# Patient Record
Sex: Female | Born: 1960 | Race: Black or African American | Hispanic: No | Marital: Single | Smoking: Never smoker
Health system: Southern US, Community
[De-identification: ages and names within clinical notes are randomized; demographics above are authoritative.]

## PROBLEM LIST (undated history)

## (undated) DIAGNOSIS — E079 Disorder of thyroid, unspecified: Secondary | ICD-10-CM

## (undated) HISTORY — PX: GASTRIC BYPASS: SHX52

---

## 2021-07-10 ENCOUNTER — Emergency Department
Admission: EM | Admit: 2021-07-10 | Discharge: 2021-07-10 | Disposition: A | Discharge status: Home / Self Care | Payer: Self-pay | Attending: Emergency Medicine | Admitting: Emergency Medicine

## 2021-07-10 ENCOUNTER — Other Ambulatory Visit: Payer: Self-pay

## 2021-07-10 DIAGNOSIS — C4922 Malignant neoplasm of connective and soft tissue of left lower limb, including hip: Secondary | ICD-10-CM | POA: Insufficient documentation

## 2021-07-10 HISTORY — DX: Disorder of thyroid, unspecified: E07.9

## 2021-07-10 MED ORDER — HYDROCODONE-ACETAMINOPHEN 5-325 MG PO TABS: 1.0000 | ORAL_TABLET | Freq: Four times a day (QID) | ORAL | 0 refills | Status: DC | PRN

## 2021-07-10 NOTE — ED Triage Notes (Signed)
Pt to ED for left groin pain for over a month. States has already had MRI and was referred to ortho oncologist d/t mass found. States she is in pain and here to get seen for "everything"

## 2021-07-10 NOTE — Discharge Instructions (Addendum)
[facility] [place], [place], [place] [address] Phone: [phone]  [facility] Call [phone] (between 8:00am - 4:30pm, Monday - Friday)

## 2021-07-10 NOTE — ED Provider Notes (Signed)
[facility] Emergency Department Provider Note  ____________________________________________   Event Date/Time   First MD Initiated Contact with Patient 07/10/21 [phone]     (approximate)  I have reviewed the triage vital signs and the nursing notes.   HISTORY  Chief Complaint Groin Pain    HPI Jennifer Butler is a 60 y.o. female presents emergency department complaining of left groin pain.  Patient was seen at a urgent care in [place] and was then told she needed an MRI.  Had an MRI done which showed a sarcoma.  Patient states she is new to the area and does not know who to follow-up with.  She is continuing to have pain.  She does have her reports and disc showing the sarcoma  Past Medical History:  Diagnosis Date   Thyroid disease     There are no problems to display for this patient.   Past Surgical History:  Procedure Laterality Date   GASTRIC BYPASS      Prior to Admission medications   Medication Sig Start Date End Date Taking? Authorizing Provider  HYDROcodone-acetaminophen (NORCO/VICODIN) 5-325 MG tablet Take 1 tablet by mouth every 6 (six) hours as needed for moderate pain. 07/10/21  Yes Felesha Moncrieffe, Linden Dolin, PA-C  levothyroxine (SYNTHROID) 112 MCG tablet Take 112 mcg by mouth daily before breakfast.   Yes [provider]    Allergies Zithromax [azithromycin], Eliquis [apixaban], and Xarelto [rivaroxaban]  No family history on file.  Social History    Review of Systems  Constitutional: No fever/chills Eyes: No visual changes. ENT: No sore throat. Respiratory: Denies cough Cardiovascular: Denies chest pain Gastrointestinal: Denies abdominal pain Genitourinary: Negative for dysuria. Musculoskeletal: Negative for back pain. Skin: Negative for rash. Psychiatric: no mood changes,     ____________________________________________   PHYSICAL EXAM:  VITAL SIGNS: ED Triage Vitals  Enc Vitals Group     BP 07/10/21  0902 129/73     Pulse Rate 07/10/21 0902 83     Resp 07/10/21 0902 18     Temp 07/10/21 0902 98.1 F (36.7 C)     Temp Source 07/10/21 0902 Oral     SpO2 07/10/21 0902 99 %     Weight 07/10/21 0906 180 lb (81.6 kg)     Height 07/10/21 0906 5\' 6"  (1.676 m)     Head Circumference --      Peak Flow --      Pain Score 07/10/21 0904 10     Pain Loc --      Pain Edu? --      Excl. in [place]? --     Constitutional: Alert and oriented. Well appearing and in no acute distress. Eyes: Conjunctivae are normal.  Head: Atraumatic. Nose: No congestion/rhinnorhea. Mouth/Throat: Mucous membranes are moist.   Neck:  supple no lymphadenopathy noted Cardiovascular: Normal rate, regular rhythm.  Respiratory: Normal respiratory effort.  No retractions,  GU: deferred Musculoskeletal: FROM all extremities, warm and well perfused Neurologic:  Normal speech and language.  Skin:  Skin is warm, dry and intact. No rash noted. Psychiatric: Mood and affect are normal. Speech and behavior are normal.  ____________________________________________   LABS (all labs ordered are listed, but only abnormal results are displayed)  Labs Reviewed - No data to display ____________________________________________   ____________________________________________  RADIOLOGY    ____________________________________________   PROCEDURES  Procedure(s) performed: No  Procedures    ____________________________________________   INITIAL IMPRESSION / ASSESSMENT AND PLAN / ED COURSE  Pertinent labs &  imaging results that were available during my care of the patient were reviewed by me and considered in my medical decision making (see chart for details).   60 year old female presents emergency department with concerns of a sarcoma in her left thigh/groin area.  See HPI.  Physical exam shows patient per stable  The patient's report states that she does have a sarcoma but appears to be in the muscle.    Consult  orthopedics.  Dr.Poggi states they usually send those patients to [facility] orthopedic oncology.  I did discuss this with the patient.  Explained her she could follow-up with either [facility] orthopedic oncology or [facility].  Phone numbers for both clinics were given to the patient.  She is uninsured but I did reassure her that they have charity care and will help her financially.  She was given a prescription for pain medication.  Discharged in stable condition.     Jennifer Butler was evaluated in Emergency Department on 07/10/2021 for the symptoms described in the history of present illness. She was evaluated in the context of the global COVID-19 pandemic, which necessitated consideration that the patient might be at risk for infection with the SARS-CoV-2 virus that causes COVID-19. Institutional protocols and algorithms that pertain to the evaluation of patients at risk for COVID-19 are in a state of rapid change based on information released by regulatory bodies including the CDC and federal and state organizations. These policies and algorithms were followed during the patient's care in the ED.    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     Note:  This document was prepared using Dragon voice recognition software and may include unintentional dictation errors.    Versie Starks, PA-C 07/10/21 1044    Blake Divine, MD 07/10/21  [phone]

## 2021-09-05 DIAGNOSIS — M7989 Other specified soft tissue disorders: Secondary | ICD-10-CM | POA: Diagnosis not present

## 2021-09-05 DIAGNOSIS — C499 Malignant neoplasm of connective and soft tissue, unspecified: Secondary | ICD-10-CM | POA: Diagnosis not present

## 2021-09-05 DIAGNOSIS — C4920 Malignant neoplasm of connective and soft tissue of unspecified lower limb, including hip: Secondary | ICD-10-CM | POA: Diagnosis not present

## 2021-09-06 DIAGNOSIS — C4920 Malignant neoplasm of connective and soft tissue of unspecified lower limb, including hip: Secondary | ICD-10-CM | POA: Diagnosis not present

## 2021-09-07 DIAGNOSIS — C4920 Malignant neoplasm of connective and soft tissue of unspecified lower limb, including hip: Secondary | ICD-10-CM | POA: Diagnosis not present

## 2021-09-08 DIAGNOSIS — C4920 Malignant neoplasm of connective and soft tissue of unspecified lower limb, including hip: Secondary | ICD-10-CM | POA: Diagnosis not present

## 2021-09-11 DIAGNOSIS — C4920 Malignant neoplasm of connective and soft tissue of unspecified lower limb, including hip: Secondary | ICD-10-CM | POA: Diagnosis not present

## 2021-09-12 DIAGNOSIS — C4920 Malignant neoplasm of connective and soft tissue of unspecified lower limb, including hip: Secondary | ICD-10-CM | POA: Diagnosis not present

## 2021-09-12 DIAGNOSIS — C499 Malignant neoplasm of connective and soft tissue, unspecified: Secondary | ICD-10-CM | POA: Diagnosis not present

## 2021-09-13 DIAGNOSIS — C4920 Malignant neoplasm of connective and soft tissue of unspecified lower limb, including hip: Secondary | ICD-10-CM | POA: Diagnosis not present

## 2021-09-14 DIAGNOSIS — C499 Malignant neoplasm of connective and soft tissue, unspecified: Secondary | ICD-10-CM | POA: Diagnosis not present

## 2021-09-14 DIAGNOSIS — C4920 Malignant neoplasm of connective and soft tissue of unspecified lower limb, including hip: Secondary | ICD-10-CM | POA: Diagnosis not present

## 2021-09-19 DIAGNOSIS — C499 Malignant neoplasm of connective and soft tissue, unspecified: Secondary | ICD-10-CM | POA: Diagnosis not present

## 2021-09-19 DIAGNOSIS — Z20822 Contact with and (suspected) exposure to covid-19: Secondary | ICD-10-CM | POA: Diagnosis not present

## 2021-09-20 DIAGNOSIS — Z9884 Bariatric surgery status: Secondary | ICD-10-CM | POA: Diagnosis not present

## 2021-09-20 DIAGNOSIS — C495 Malignant neoplasm of connective and soft tissue of pelvis: Secondary | ICD-10-CM | POA: Diagnosis not present

## 2021-09-20 DIAGNOSIS — Y846 Urinary catheterization as the cause of abnormal reaction of the patient, or of later complication, without mention of misadventure at the time of the procedure: Secondary | ICD-10-CM | POA: Diagnosis not present

## 2021-09-20 DIAGNOSIS — I96 Gangrene, not elsewhere classified: Secondary | ICD-10-CM | POA: Diagnosis not present

## 2021-09-20 DIAGNOSIS — E039 Hypothyroidism, unspecified: Secondary | ICD-10-CM | POA: Diagnosis not present

## 2021-09-20 DIAGNOSIS — T83518A Infection and inflammatory reaction due to other urinary catheter, initial encounter: Secondary | ICD-10-CM | POA: Diagnosis not present

## 2021-09-20 DIAGNOSIS — Z881 Allergy status to other antibiotic agents status: Secondary | ICD-10-CM | POA: Diagnosis not present

## 2021-09-20 DIAGNOSIS — C499 Malignant neoplasm of connective and soft tissue, unspecified: Secondary | ICD-10-CM | POA: Diagnosis not present

## 2021-09-20 DIAGNOSIS — Z923 Personal history of irradiation: Secondary | ICD-10-CM | POA: Diagnosis not present

## 2021-09-20 DIAGNOSIS — G8918 Other acute postprocedural pain: Secondary | ICD-10-CM | POA: Diagnosis not present

## 2021-09-20 DIAGNOSIS — N390 Urinary tract infection, site not specified: Secondary | ICD-10-CM | POA: Diagnosis not present

## 2021-09-20 DIAGNOSIS — C4922 Malignant neoplasm of connective and soft tissue of left lower limb, including hip: Secondary | ICD-10-CM | POA: Diagnosis not present

## 2021-09-20 DIAGNOSIS — Z6829 Body mass index (BMI) 29.0-29.9, adult: Secondary | ICD-10-CM | POA: Diagnosis not present

## 2021-09-20 DIAGNOSIS — E43 Unspecified severe protein-calorie malnutrition: Secondary | ICD-10-CM | POA: Diagnosis not present

## 2021-09-20 DIAGNOSIS — Z9221 Personal history of antineoplastic chemotherapy: Secondary | ICD-10-CM | POA: Diagnosis not present

## 2021-09-20 DIAGNOSIS — D62 Acute posthemorrhagic anemia: Secondary | ICD-10-CM | POA: Diagnosis not present

## 2021-09-22 DIAGNOSIS — T8092XA Unspecified transfusion reaction, initial encounter: Secondary | ICD-10-CM | POA: Diagnosis not present

## 2021-09-26 DIAGNOSIS — D62 Acute posthemorrhagic anemia: Secondary | ICD-10-CM | POA: Diagnosis not present

## 2021-09-26 DIAGNOSIS — R262 Difficulty in walking, not elsewhere classified: Secondary | ICD-10-CM | POA: Diagnosis not present

## 2021-09-26 DIAGNOSIS — E039 Hypothyroidism, unspecified: Secondary | ICD-10-CM | POA: Diagnosis not present

## 2021-09-26 DIAGNOSIS — R531 Weakness: Secondary | ICD-10-CM | POA: Diagnosis not present

## 2021-09-26 DIAGNOSIS — M6281 Muscle weakness (generalized): Secondary | ICD-10-CM | POA: Diagnosis not present

## 2021-09-26 DIAGNOSIS — E43 Unspecified severe protein-calorie malnutrition: Secondary | ICD-10-CM | POA: Diagnosis not present

## 2021-09-26 DIAGNOSIS — L089 Local infection of the skin and subcutaneous tissue, unspecified: Secondary | ICD-10-CM | POA: Diagnosis not present

## 2021-09-26 DIAGNOSIS — Z299 Encounter for prophylactic measures, unspecified: Secondary | ICD-10-CM | POA: Diagnosis not present

## 2021-09-26 DIAGNOSIS — K219 Gastro-esophageal reflux disease without esophagitis: Secondary | ICD-10-CM | POA: Diagnosis not present

## 2021-09-26 DIAGNOSIS — C499 Malignant neoplasm of connective and soft tissue, unspecified: Secondary | ICD-10-CM | POA: Diagnosis not present

## 2021-09-26 DIAGNOSIS — M6259 Muscle wasting and atrophy, not elsewhere classified, multiple sites: Secondary | ICD-10-CM | POA: Diagnosis not present

## 2021-09-26 DIAGNOSIS — E569 Vitamin deficiency, unspecified: Secondary | ICD-10-CM | POA: Diagnosis not present

## 2021-09-26 DIAGNOSIS — Z86718 Personal history of other venous thrombosis and embolism: Secondary | ICD-10-CM | POA: Diagnosis not present

## 2021-09-26 DIAGNOSIS — C4921 Malignant neoplasm of connective and soft tissue of right lower limb, including hip: Secondary | ICD-10-CM | POA: Diagnosis not present

## 2021-10-03 DIAGNOSIS — C4921 Malignant neoplasm of connective and soft tissue of right lower limb, including hip: Secondary | ICD-10-CM | POA: Diagnosis not present

## 2021-10-03 DIAGNOSIS — M6281 Muscle weakness (generalized): Secondary | ICD-10-CM | POA: Diagnosis not present

## 2021-10-03 DIAGNOSIS — Z299 Encounter for prophylactic measures, unspecified: Secondary | ICD-10-CM | POA: Diagnosis not present

## 2021-10-05 DIAGNOSIS — M6281 Muscle weakness (generalized): Secondary | ICD-10-CM | POA: Diagnosis not present

## 2021-10-05 DIAGNOSIS — Z299 Encounter for prophylactic measures, unspecified: Secondary | ICD-10-CM | POA: Diagnosis not present

## 2021-10-05 DIAGNOSIS — C4921 Malignant neoplasm of connective and soft tissue of right lower limb, including hip: Secondary | ICD-10-CM | POA: Diagnosis not present

## 2021-10-09 DIAGNOSIS — C4921 Malignant neoplasm of connective and soft tissue of right lower limb, including hip: Secondary | ICD-10-CM | POA: Diagnosis not present

## 2021-10-09 DIAGNOSIS — Z299 Encounter for prophylactic measures, unspecified: Secondary | ICD-10-CM | POA: Diagnosis not present

## 2021-10-09 DIAGNOSIS — M6281 Muscle weakness (generalized): Secondary | ICD-10-CM | POA: Diagnosis not present

## 2021-10-10 DIAGNOSIS — C499 Malignant neoplasm of connective and soft tissue, unspecified: Secondary | ICD-10-CM | POA: Diagnosis not present

## 2021-10-11 DIAGNOSIS — M6281 Muscle weakness (generalized): Secondary | ICD-10-CM | POA: Diagnosis not present

## 2021-10-11 DIAGNOSIS — Z299 Encounter for prophylactic measures, unspecified: Secondary | ICD-10-CM | POA: Diagnosis not present

## 2021-10-11 DIAGNOSIS — C4921 Malignant neoplasm of connective and soft tissue of right lower limb, including hip: Secondary | ICD-10-CM | POA: Diagnosis not present

## 2021-10-16 DIAGNOSIS — D62 Acute posthemorrhagic anemia: Secondary | ICD-10-CM | POA: Diagnosis not present

## 2021-10-16 DIAGNOSIS — E43 Unspecified severe protein-calorie malnutrition: Secondary | ICD-10-CM | POA: Diagnosis not present

## 2021-10-16 DIAGNOSIS — E569 Vitamin deficiency, unspecified: Secondary | ICD-10-CM | POA: Diagnosis not present

## 2021-10-16 DIAGNOSIS — E039 Hypothyroidism, unspecified: Secondary | ICD-10-CM | POA: Diagnosis not present

## 2021-10-16 DIAGNOSIS — G8929 Other chronic pain: Secondary | ICD-10-CM | POA: Diagnosis not present

## 2021-10-16 DIAGNOSIS — M25552 Pain in left hip: Secondary | ICD-10-CM | POA: Diagnosis not present

## 2021-10-16 DIAGNOSIS — M62838 Other muscle spasm: Secondary | ICD-10-CM | POA: Diagnosis not present

## 2021-10-16 DIAGNOSIS — Z483 Aftercare following surgery for neoplasm: Secondary | ICD-10-CM | POA: Diagnosis not present

## 2021-10-16 DIAGNOSIS — K5900 Constipation, unspecified: Secondary | ICD-10-CM | POA: Diagnosis not present

## 2021-10-16 DIAGNOSIS — K219 Gastro-esophageal reflux disease without esophagitis: Secondary | ICD-10-CM | POA: Diagnosis not present

## 2021-10-19 DIAGNOSIS — K5900 Constipation, unspecified: Secondary | ICD-10-CM | POA: Diagnosis not present

## 2021-10-19 DIAGNOSIS — K219 Gastro-esophageal reflux disease without esophagitis: Secondary | ICD-10-CM | POA: Diagnosis not present

## 2021-10-19 DIAGNOSIS — M7989 Other specified soft tissue disorders: Secondary | ICD-10-CM | POA: Diagnosis not present

## 2021-10-19 DIAGNOSIS — E039 Hypothyroidism, unspecified: Secondary | ICD-10-CM | POA: Diagnosis not present

## 2021-10-19 DIAGNOSIS — G8929 Other chronic pain: Secondary | ICD-10-CM | POA: Diagnosis not present

## 2021-10-19 DIAGNOSIS — C499 Malignant neoplasm of connective and soft tissue, unspecified: Secondary | ICD-10-CM | POA: Diagnosis not present

## 2021-10-19 DIAGNOSIS — M25552 Pain in left hip: Secondary | ICD-10-CM | POA: Diagnosis not present

## 2021-10-19 DIAGNOSIS — Z483 Aftercare following surgery for neoplasm: Secondary | ICD-10-CM | POA: Diagnosis not present

## 2021-10-19 DIAGNOSIS — E43 Unspecified severe protein-calorie malnutrition: Secondary | ICD-10-CM | POA: Diagnosis not present

## 2021-10-19 DIAGNOSIS — D62 Acute posthemorrhagic anemia: Secondary | ICD-10-CM | POA: Diagnosis not present

## 2021-10-19 DIAGNOSIS — M62838 Other muscle spasm: Secondary | ICD-10-CM | POA: Diagnosis not present

## 2021-10-19 DIAGNOSIS — E569 Vitamin deficiency, unspecified: Secondary | ICD-10-CM | POA: Diagnosis not present

## 2021-10-20 DIAGNOSIS — E039 Hypothyroidism, unspecified: Secondary | ICD-10-CM | POA: Diagnosis not present

## 2021-10-20 DIAGNOSIS — D62 Acute posthemorrhagic anemia: Secondary | ICD-10-CM | POA: Diagnosis not present

## 2021-10-20 DIAGNOSIS — K5900 Constipation, unspecified: Secondary | ICD-10-CM | POA: Diagnosis not present

## 2021-10-20 DIAGNOSIS — M25552 Pain in left hip: Secondary | ICD-10-CM | POA: Diagnosis not present

## 2021-10-20 DIAGNOSIS — E43 Unspecified severe protein-calorie malnutrition: Secondary | ICD-10-CM | POA: Diagnosis not present

## 2021-10-20 DIAGNOSIS — K219 Gastro-esophageal reflux disease without esophagitis: Secondary | ICD-10-CM | POA: Diagnosis not present

## 2021-10-20 DIAGNOSIS — M62838 Other muscle spasm: Secondary | ICD-10-CM | POA: Diagnosis not present

## 2021-10-20 DIAGNOSIS — Z483 Aftercare following surgery for neoplasm: Secondary | ICD-10-CM | POA: Diagnosis not present

## 2021-10-20 DIAGNOSIS — E569 Vitamin deficiency, unspecified: Secondary | ICD-10-CM | POA: Diagnosis not present

## 2021-10-20 DIAGNOSIS — G8929 Other chronic pain: Secondary | ICD-10-CM | POA: Diagnosis not present

## 2021-10-23 DIAGNOSIS — K5900 Constipation, unspecified: Secondary | ICD-10-CM | POA: Diagnosis not present

## 2021-10-23 DIAGNOSIS — G8929 Other chronic pain: Secondary | ICD-10-CM | POA: Diagnosis not present

## 2021-10-23 DIAGNOSIS — E569 Vitamin deficiency, unspecified: Secondary | ICD-10-CM | POA: Diagnosis not present

## 2021-10-23 DIAGNOSIS — K219 Gastro-esophageal reflux disease without esophagitis: Secondary | ICD-10-CM | POA: Diagnosis not present

## 2021-10-23 DIAGNOSIS — E039 Hypothyroidism, unspecified: Secondary | ICD-10-CM | POA: Diagnosis not present

## 2021-10-23 DIAGNOSIS — D62 Acute posthemorrhagic anemia: Secondary | ICD-10-CM | POA: Diagnosis not present

## 2021-10-23 DIAGNOSIS — M25552 Pain in left hip: Secondary | ICD-10-CM | POA: Diagnosis not present

## 2021-10-23 DIAGNOSIS — M62838 Other muscle spasm: Secondary | ICD-10-CM | POA: Diagnosis not present

## 2021-10-23 DIAGNOSIS — E43 Unspecified severe protein-calorie malnutrition: Secondary | ICD-10-CM | POA: Diagnosis not present

## 2021-10-23 DIAGNOSIS — Z483 Aftercare following surgery for neoplasm: Secondary | ICD-10-CM | POA: Diagnosis not present

## 2021-10-25 DIAGNOSIS — K5900 Constipation, unspecified: Secondary | ICD-10-CM | POA: Diagnosis not present

## 2021-10-25 DIAGNOSIS — G8929 Other chronic pain: Secondary | ICD-10-CM | POA: Diagnosis not present

## 2021-10-25 DIAGNOSIS — E039 Hypothyroidism, unspecified: Secondary | ICD-10-CM | POA: Diagnosis not present

## 2021-10-25 DIAGNOSIS — E569 Vitamin deficiency, unspecified: Secondary | ICD-10-CM | POA: Diagnosis not present

## 2021-10-25 DIAGNOSIS — K219 Gastro-esophageal reflux disease without esophagitis: Secondary | ICD-10-CM | POA: Diagnosis not present

## 2021-10-25 DIAGNOSIS — M25552 Pain in left hip: Secondary | ICD-10-CM | POA: Diagnosis not present

## 2021-10-25 DIAGNOSIS — M62838 Other muscle spasm: Secondary | ICD-10-CM | POA: Diagnosis not present

## 2021-10-25 DIAGNOSIS — Z483 Aftercare following surgery for neoplasm: Secondary | ICD-10-CM | POA: Diagnosis not present

## 2021-10-25 DIAGNOSIS — E43 Unspecified severe protein-calorie malnutrition: Secondary | ICD-10-CM | POA: Diagnosis not present

## 2021-10-25 DIAGNOSIS — D62 Acute posthemorrhagic anemia: Secondary | ICD-10-CM | POA: Diagnosis not present

## 2021-10-26 DIAGNOSIS — C499 Malignant neoplasm of connective and soft tissue, unspecified: Secondary | ICD-10-CM | POA: Diagnosis not present

## 2021-10-26 DIAGNOSIS — R918 Other nonspecific abnormal finding of lung field: Secondary | ICD-10-CM | POA: Diagnosis not present

## 2021-10-26 DIAGNOSIS — C4920 Malignant neoplasm of connective and soft tissue of unspecified lower limb, including hip: Secondary | ICD-10-CM | POA: Diagnosis not present

## 2021-10-27 DIAGNOSIS — E43 Unspecified severe protein-calorie malnutrition: Secondary | ICD-10-CM | POA: Diagnosis not present

## 2021-10-27 DIAGNOSIS — E039 Hypothyroidism, unspecified: Secondary | ICD-10-CM | POA: Diagnosis not present

## 2021-10-27 DIAGNOSIS — M25552 Pain in left hip: Secondary | ICD-10-CM | POA: Diagnosis not present

## 2021-10-27 DIAGNOSIS — Z483 Aftercare following surgery for neoplasm: Secondary | ICD-10-CM | POA: Diagnosis not present

## 2021-10-27 DIAGNOSIS — D62 Acute posthemorrhagic anemia: Secondary | ICD-10-CM | POA: Diagnosis not present

## 2021-10-27 DIAGNOSIS — K5900 Constipation, unspecified: Secondary | ICD-10-CM | POA: Diagnosis not present

## 2021-10-27 DIAGNOSIS — M62838 Other muscle spasm: Secondary | ICD-10-CM | POA: Diagnosis not present

## 2021-10-27 DIAGNOSIS — K219 Gastro-esophageal reflux disease without esophagitis: Secondary | ICD-10-CM | POA: Diagnosis not present

## 2021-10-27 DIAGNOSIS — G8929 Other chronic pain: Secondary | ICD-10-CM | POA: Diagnosis not present

## 2021-10-27 DIAGNOSIS — E569 Vitamin deficiency, unspecified: Secondary | ICD-10-CM | POA: Diagnosis not present

## 2021-10-30 DIAGNOSIS — G8929 Other chronic pain: Secondary | ICD-10-CM | POA: Diagnosis not present

## 2021-10-30 DIAGNOSIS — M62838 Other muscle spasm: Secondary | ICD-10-CM | POA: Diagnosis not present

## 2021-10-30 DIAGNOSIS — Z483 Aftercare following surgery for neoplasm: Secondary | ICD-10-CM | POA: Diagnosis not present

## 2021-10-30 DIAGNOSIS — K5900 Constipation, unspecified: Secondary | ICD-10-CM | POA: Diagnosis not present

## 2021-10-30 DIAGNOSIS — E569 Vitamin deficiency, unspecified: Secondary | ICD-10-CM | POA: Diagnosis not present

## 2021-10-30 DIAGNOSIS — K219 Gastro-esophageal reflux disease without esophagitis: Secondary | ICD-10-CM | POA: Diagnosis not present

## 2021-10-30 DIAGNOSIS — E039 Hypothyroidism, unspecified: Secondary | ICD-10-CM | POA: Diagnosis not present

## 2021-10-30 DIAGNOSIS — M25552 Pain in left hip: Secondary | ICD-10-CM | POA: Diagnosis not present

## 2021-10-30 DIAGNOSIS — D62 Acute posthemorrhagic anemia: Secondary | ICD-10-CM | POA: Diagnosis not present

## 2021-10-30 DIAGNOSIS — E43 Unspecified severe protein-calorie malnutrition: Secondary | ICD-10-CM | POA: Diagnosis not present

## 2021-10-31 DIAGNOSIS — R2689 Other abnormalities of gait and mobility: Secondary | ICD-10-CM | POA: Diagnosis not present

## 2021-10-31 DIAGNOSIS — M6281 Muscle weakness (generalized): Secondary | ICD-10-CM | POA: Diagnosis not present

## 2021-10-31 DIAGNOSIS — L905 Scar conditions and fibrosis of skin: Secondary | ICD-10-CM | POA: Diagnosis not present

## 2021-10-31 DIAGNOSIS — C4920 Malignant neoplasm of connective and soft tissue of unspecified lower limb, including hip: Secondary | ICD-10-CM | POA: Diagnosis not present

## 2021-10-31 DIAGNOSIS — I890 Lymphedema, not elsewhere classified: Secondary | ICD-10-CM | POA: Diagnosis not present

## 2021-11-02 DIAGNOSIS — M6281 Muscle weakness (generalized): Secondary | ICD-10-CM | POA: Diagnosis not present

## 2021-11-02 DIAGNOSIS — C4920 Malignant neoplasm of connective and soft tissue of unspecified lower limb, including hip: Secondary | ICD-10-CM | POA: Diagnosis not present

## 2021-11-02 DIAGNOSIS — L905 Scar conditions and fibrosis of skin: Secondary | ICD-10-CM | POA: Diagnosis not present

## 2021-11-02 DIAGNOSIS — R2689 Other abnormalities of gait and mobility: Secondary | ICD-10-CM | POA: Diagnosis not present

## 2021-11-02 DIAGNOSIS — I890 Lymphedema, not elsewhere classified: Secondary | ICD-10-CM | POA: Diagnosis not present

## 2021-11-03 DIAGNOSIS — C4920 Malignant neoplasm of connective and soft tissue of unspecified lower limb, including hip: Secondary | ICD-10-CM | POA: Diagnosis not present

## 2021-11-06 DIAGNOSIS — C4920 Malignant neoplasm of connective and soft tissue of unspecified lower limb, including hip: Secondary | ICD-10-CM | POA: Diagnosis not present

## 2021-11-07 DIAGNOSIS — R2689 Other abnormalities of gait and mobility: Secondary | ICD-10-CM | POA: Diagnosis not present

## 2021-11-07 DIAGNOSIS — M6281 Muscle weakness (generalized): Secondary | ICD-10-CM | POA: Diagnosis not present

## 2021-11-07 DIAGNOSIS — I890 Lymphedema, not elsewhere classified: Secondary | ICD-10-CM | POA: Diagnosis not present

## 2021-11-07 DIAGNOSIS — C4920 Malignant neoplasm of connective and soft tissue of unspecified lower limb, including hip: Secondary | ICD-10-CM | POA: Diagnosis not present

## 2021-11-07 DIAGNOSIS — L905 Scar conditions and fibrosis of skin: Secondary | ICD-10-CM | POA: Diagnosis not present

## 2021-11-08 DIAGNOSIS — C4920 Malignant neoplasm of connective and soft tissue of unspecified lower limb, including hip: Secondary | ICD-10-CM | POA: Diagnosis not present

## 2021-11-09 DIAGNOSIS — R2689 Other abnormalities of gait and mobility: Secondary | ICD-10-CM | POA: Diagnosis not present

## 2021-11-09 DIAGNOSIS — M6281 Muscle weakness (generalized): Secondary | ICD-10-CM | POA: Diagnosis not present

## 2021-11-09 DIAGNOSIS — I890 Lymphedema, not elsewhere classified: Secondary | ICD-10-CM | POA: Diagnosis not present

## 2021-11-09 DIAGNOSIS — L905 Scar conditions and fibrosis of skin: Secondary | ICD-10-CM | POA: Diagnosis not present

## 2021-11-09 DIAGNOSIS — C4920 Malignant neoplasm of connective and soft tissue of unspecified lower limb, including hip: Secondary | ICD-10-CM | POA: Diagnosis not present

## 2021-11-10 DIAGNOSIS — I890 Lymphedema, not elsewhere classified: Secondary | ICD-10-CM | POA: Diagnosis not present

## 2021-11-10 DIAGNOSIS — L905 Scar conditions and fibrosis of skin: Secondary | ICD-10-CM | POA: Diagnosis not present

## 2021-11-10 DIAGNOSIS — C4920 Malignant neoplasm of connective and soft tissue of unspecified lower limb, including hip: Secondary | ICD-10-CM | POA: Diagnosis not present

## 2021-11-10 DIAGNOSIS — R2689 Other abnormalities of gait and mobility: Secondary | ICD-10-CM | POA: Diagnosis not present

## 2021-11-10 DIAGNOSIS — M6281 Muscle weakness (generalized): Secondary | ICD-10-CM | POA: Diagnosis not present

## 2021-11-13 DIAGNOSIS — C4920 Malignant neoplasm of connective and soft tissue of unspecified lower limb, including hip: Secondary | ICD-10-CM | POA: Diagnosis not present

## 2021-11-13 DIAGNOSIS — I890 Lymphedema, not elsewhere classified: Secondary | ICD-10-CM | POA: Diagnosis not present

## 2021-11-14 DIAGNOSIS — I890 Lymphedema, not elsewhere classified: Secondary | ICD-10-CM | POA: Diagnosis not present

## 2021-11-14 DIAGNOSIS — M6281 Muscle weakness (generalized): Secondary | ICD-10-CM | POA: Diagnosis not present

## 2021-11-14 DIAGNOSIS — R2689 Other abnormalities of gait and mobility: Secondary | ICD-10-CM | POA: Diagnosis not present

## 2021-11-14 DIAGNOSIS — C4920 Malignant neoplasm of connective and soft tissue of unspecified lower limb, including hip: Secondary | ICD-10-CM | POA: Diagnosis not present

## 2021-11-14 DIAGNOSIS — L905 Scar conditions and fibrosis of skin: Secondary | ICD-10-CM | POA: Diagnosis not present

## 2021-11-15 DIAGNOSIS — C4920 Malignant neoplasm of connective and soft tissue of unspecified lower limb, including hip: Secondary | ICD-10-CM | POA: Diagnosis not present

## 2021-11-16 DIAGNOSIS — L905 Scar conditions and fibrosis of skin: Secondary | ICD-10-CM | POA: Diagnosis not present

## 2021-11-16 DIAGNOSIS — I890 Lymphedema, not elsewhere classified: Secondary | ICD-10-CM | POA: Diagnosis not present

## 2021-11-16 DIAGNOSIS — C499 Malignant neoplasm of connective and soft tissue, unspecified: Secondary | ICD-10-CM | POA: Diagnosis not present

## 2021-11-16 DIAGNOSIS — R2689 Other abnormalities of gait and mobility: Secondary | ICD-10-CM | POA: Diagnosis not present

## 2021-11-16 DIAGNOSIS — M6281 Muscle weakness (generalized): Secondary | ICD-10-CM | POA: Diagnosis not present

## 2021-11-16 DIAGNOSIS — C4920 Malignant neoplasm of connective and soft tissue of unspecified lower limb, including hip: Secondary | ICD-10-CM | POA: Diagnosis not present

## 2021-11-16 DIAGNOSIS — Z7689 Persons encountering health services in other specified circumstances: Secondary | ICD-10-CM | POA: Diagnosis not present

## 2021-11-17 DIAGNOSIS — C4920 Malignant neoplasm of connective and soft tissue of unspecified lower limb, including hip: Secondary | ICD-10-CM | POA: Diagnosis not present

## 2021-11-17 DIAGNOSIS — I890 Lymphedema, not elsewhere classified: Secondary | ICD-10-CM | POA: Diagnosis not present

## 2021-11-20 DIAGNOSIS — C4920 Malignant neoplasm of connective and soft tissue of unspecified lower limb, including hip: Secondary | ICD-10-CM | POA: Diagnosis not present

## 2021-11-21 DIAGNOSIS — L905 Scar conditions and fibrosis of skin: Secondary | ICD-10-CM | POA: Diagnosis not present

## 2021-11-21 DIAGNOSIS — M6281 Muscle weakness (generalized): Secondary | ICD-10-CM | POA: Diagnosis not present

## 2021-11-21 DIAGNOSIS — C4920 Malignant neoplasm of connective and soft tissue of unspecified lower limb, including hip: Secondary | ICD-10-CM | POA: Diagnosis not present

## 2021-11-21 DIAGNOSIS — I890 Lymphedema, not elsewhere classified: Secondary | ICD-10-CM | POA: Diagnosis not present

## 2021-11-21 DIAGNOSIS — R2689 Other abnormalities of gait and mobility: Secondary | ICD-10-CM | POA: Diagnosis not present

## 2021-11-22 DIAGNOSIS — C4920 Malignant neoplasm of connective and soft tissue of unspecified lower limb, including hip: Secondary | ICD-10-CM | POA: Diagnosis not present

## 2021-11-23 DIAGNOSIS — L905 Scar conditions and fibrosis of skin: Secondary | ICD-10-CM | POA: Diagnosis not present

## 2021-11-23 DIAGNOSIS — I890 Lymphedema, not elsewhere classified: Secondary | ICD-10-CM | POA: Diagnosis not present

## 2021-11-23 DIAGNOSIS — C4920 Malignant neoplasm of connective and soft tissue of unspecified lower limb, including hip: Secondary | ICD-10-CM | POA: Diagnosis not present

## 2021-11-23 DIAGNOSIS — M6281 Muscle weakness (generalized): Secondary | ICD-10-CM | POA: Diagnosis not present

## 2021-11-23 DIAGNOSIS — R2689 Other abnormalities of gait and mobility: Secondary | ICD-10-CM | POA: Diagnosis not present

## 2021-11-24 DIAGNOSIS — C4920 Malignant neoplasm of connective and soft tissue of unspecified lower limb, including hip: Secondary | ICD-10-CM | POA: Diagnosis not present

## 2021-11-27 DIAGNOSIS — I890 Lymphedema, not elsewhere classified: Secondary | ICD-10-CM | POA: Diagnosis not present

## 2021-11-27 DIAGNOSIS — L905 Scar conditions and fibrosis of skin: Secondary | ICD-10-CM | POA: Diagnosis not present

## 2021-11-27 DIAGNOSIS — C4920 Malignant neoplasm of connective and soft tissue of unspecified lower limb, including hip: Secondary | ICD-10-CM | POA: Diagnosis not present

## 2021-11-27 DIAGNOSIS — R2689 Other abnormalities of gait and mobility: Secondary | ICD-10-CM | POA: Diagnosis not present

## 2021-11-27 DIAGNOSIS — M6281 Muscle weakness (generalized): Secondary | ICD-10-CM | POA: Diagnosis not present

## 2021-11-28 DIAGNOSIS — C4920 Malignant neoplasm of connective and soft tissue of unspecified lower limb, including hip: Secondary | ICD-10-CM | POA: Diagnosis not present

## 2021-11-29 DIAGNOSIS — C4920 Malignant neoplasm of connective and soft tissue of unspecified lower limb, including hip: Secondary | ICD-10-CM | POA: Diagnosis not present

## 2021-11-30 DIAGNOSIS — R2689 Other abnormalities of gait and mobility: Secondary | ICD-10-CM | POA: Diagnosis not present

## 2021-11-30 DIAGNOSIS — I890 Lymphedema, not elsewhere classified: Secondary | ICD-10-CM | POA: Diagnosis not present

## 2021-11-30 DIAGNOSIS — M6281 Muscle weakness (generalized): Secondary | ICD-10-CM | POA: Diagnosis not present

## 2021-11-30 DIAGNOSIS — L905 Scar conditions and fibrosis of skin: Secondary | ICD-10-CM | POA: Diagnosis not present

## 2021-12-04 DIAGNOSIS — L905 Scar conditions and fibrosis of skin: Secondary | ICD-10-CM | POA: Diagnosis not present

## 2021-12-04 DIAGNOSIS — M6281 Muscle weakness (generalized): Secondary | ICD-10-CM | POA: Diagnosis not present

## 2021-12-04 DIAGNOSIS — R2689 Other abnormalities of gait and mobility: Secondary | ICD-10-CM | POA: Diagnosis not present

## 2021-12-04 DIAGNOSIS — I890 Lymphedema, not elsewhere classified: Secondary | ICD-10-CM | POA: Diagnosis not present

## 2021-12-13 DIAGNOSIS — M6281 Muscle weakness (generalized): Secondary | ICD-10-CM | POA: Diagnosis not present

## 2021-12-13 DIAGNOSIS — I890 Lymphedema, not elsewhere classified: Secondary | ICD-10-CM | POA: Diagnosis not present

## 2021-12-13 DIAGNOSIS — L905 Scar conditions and fibrosis of skin: Secondary | ICD-10-CM | POA: Diagnosis not present

## 2021-12-13 DIAGNOSIS — R2689 Other abnormalities of gait and mobility: Secondary | ICD-10-CM | POA: Diagnosis not present

## 2021-12-14 DIAGNOSIS — I890 Lymphedema, not elsewhere classified: Secondary | ICD-10-CM | POA: Diagnosis not present

## 2021-12-14 DIAGNOSIS — T8189XD Other complications of procedures, not elsewhere classified, subsequent encounter: Secondary | ICD-10-CM | POA: Diagnosis not present

## 2021-12-14 DIAGNOSIS — Z923 Personal history of irradiation: Secondary | ICD-10-CM | POA: Diagnosis not present

## 2021-12-14 DIAGNOSIS — C4922 Malignant neoplasm of connective and soft tissue of left lower limb, including hip: Secondary | ICD-10-CM | POA: Diagnosis not present

## 2021-12-14 DIAGNOSIS — R609 Edema, unspecified: Secondary | ICD-10-CM | POA: Diagnosis not present

## 2021-12-14 DIAGNOSIS — R69 Illness, unspecified: Secondary | ICD-10-CM | POA: Diagnosis not present

## 2021-12-14 DIAGNOSIS — Y838 Other surgical procedures as the cause of abnormal reaction of the patient, or of later complication, without mention of misadventure at the time of the procedure: Secondary | ICD-10-CM | POA: Diagnosis not present

## 2021-12-15 DIAGNOSIS — I890 Lymphedema, not elsewhere classified: Secondary | ICD-10-CM | POA: Diagnosis not present

## 2021-12-15 DIAGNOSIS — L905 Scar conditions and fibrosis of skin: Secondary | ICD-10-CM | POA: Diagnosis not present

## 2021-12-15 DIAGNOSIS — M6281 Muscle weakness (generalized): Secondary | ICD-10-CM | POA: Diagnosis not present

## 2021-12-15 DIAGNOSIS — R2689 Other abnormalities of gait and mobility: Secondary | ICD-10-CM | POA: Diagnosis not present

## 2021-12-19 DIAGNOSIS — R2689 Other abnormalities of gait and mobility: Secondary | ICD-10-CM | POA: Diagnosis not present

## 2021-12-19 DIAGNOSIS — L905 Scar conditions and fibrosis of skin: Secondary | ICD-10-CM | POA: Diagnosis not present

## 2021-12-19 DIAGNOSIS — M6281 Muscle weakness (generalized): Secondary | ICD-10-CM | POA: Diagnosis not present

## 2021-12-19 DIAGNOSIS — I890 Lymphedema, not elsewhere classified: Secondary | ICD-10-CM | POA: Diagnosis not present

## 2021-12-23 ENCOUNTER — Emergency Department: Payer: 59

## 2021-12-23 ENCOUNTER — Other Ambulatory Visit: Payer: Self-pay

## 2021-12-23 ENCOUNTER — Encounter: Payer: Self-pay | Admitting: Emergency Medicine

## 2021-12-23 ENCOUNTER — Emergency Department
Admission: EM | Admit: 2021-12-23 | Discharge: 2021-12-23 | Disposition: A | Discharge status: Home / Self Care | Payer: 59 | Attending: Emergency Medicine | Admitting: Emergency Medicine

## 2021-12-23 DIAGNOSIS — W19XXXA Unspecified fall, initial encounter: Secondary | ICD-10-CM | POA: Diagnosis not present

## 2021-12-23 DIAGNOSIS — R0902 Hypoxemia: Secondary | ICD-10-CM | POA: Diagnosis not present

## 2021-12-23 DIAGNOSIS — M25519 Pain in unspecified shoulder: Secondary | ICD-10-CM | POA: Diagnosis not present

## 2021-12-23 DIAGNOSIS — S4992XA Unspecified injury of left shoulder and upper arm, initial encounter: Secondary | ICD-10-CM | POA: Diagnosis not present

## 2021-12-23 DIAGNOSIS — Z743 Need for continuous supervision: Secondary | ICD-10-CM | POA: Diagnosis not present

## 2021-12-23 DIAGNOSIS — W1839XA Other fall on same level, initial encounter: Secondary | ICD-10-CM | POA: Diagnosis not present

## 2021-12-23 DIAGNOSIS — S42215A Unspecified nondisplaced fracture of surgical neck of left humerus, initial encounter for closed fracture: Secondary | ICD-10-CM | POA: Diagnosis not present

## 2021-12-23 DIAGNOSIS — M25512 Pain in left shoulder: Secondary | ICD-10-CM | POA: Diagnosis not present

## 2021-12-23 DIAGNOSIS — I959 Hypotension, unspecified: Secondary | ICD-10-CM | POA: Diagnosis not present

## 2021-12-23 MED ORDER — HYDROCODONE-ACETAMINOPHEN 5-325 MG PO TABS
1.0000 | ORAL_TABLET | Freq: Once | ORAL | Status: AC
  Administered 2021-12-23: 1 via ORAL
  Filled 2021-12-23: qty 1

## 2021-12-23 MED ORDER — OXYCODONE-ACETAMINOPHEN 5-325 MG PO TABS: 1.0000 | ORAL_TABLET | Freq: Four times a day (QID) | ORAL | 0 refills | Status: AC | PRN

## 2021-12-23 MED ORDER — HYDROCODONE-ACETAMINOPHEN 5-325 MG PO TABS: 1.0000 | ORAL_TABLET | ORAL | 0 refills | Status: DC | PRN

## 2021-12-23 NOTE — Discharge Instructions (Addendum)
-  Follow-up with the orthopedist listed above as discussed.  Please keep the splint and sling on until then. ?-Treat pain with ibuprofen.  Utilize hydrocodone/acetaminophen sparingly. ?-Return to the emergency department at any time if you begin to experience any new or worsening symptoms ?

## 2021-12-23 NOTE — ED Triage Notes (Addendum)
Pt via EMS from home. Pt c/o mechanical fall after being chased after by a dog yesterday. 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 Denies any other complaints. Denies head injury. Pt is A&Ox4 and NAD  ? ?V/S per EMS: ?110/70  ?76 HR  ?20 RR  ?98% on RA ?

## 2021-12-23 NOTE — ED Notes (Signed)
Fell this morning. Pt c/o L shoulder pain. No deformity noted. No discolorations. Pt has PMS functions intact.  ?

## 2021-12-23 NOTE — ED Provider Notes (Signed)
? ?[facility] ?Provider Note ? ? ? Event Date/Time  ? First MD Initiated Contact with Patient 12/23/21 1047   ?  (approximate) ? ? ?History  ? ?Chief Complaint ?Shoulder Pain ? ? ?HPI ?Jennifer Butler is a 61 y.o. female, history of thyroid disease, presents to the emergency department for evaluation of left arm pain.  Patient states that she suffered a mechanical fall after being chased by a dog yesterday, reports landing on her left side.  Since then she has had persistent pain along the left upper arm and shoulder.  Denies any other injuries.  Denies fever/chills, chest pain, shortness of breath, abdominal pain, numbness/tingling in upper or lower extremities, or dizziness/lightheadedness ? ?History Limitations: No limitations. ? ?    ? ? ?Physical Exam  ?Triage Vital Signs: ?ED Triage Vitals  ?Enc Vitals Group  ?   BP 12/23/21 1044 (!) 104/55  ?   Pulse Rate 12/23/21 1044 71  ?   Resp 12/23/21 1044 19  ?   Temp 12/23/21 1044 98.2 ?F (36.8 ?C)  ?   Temp src --   ?   SpO2 12/23/21 1044 100 %  ?   Weight 12/23/21 1042 170 lb (77.1 kg)  ?   Height 12/23/21 1042 '5\' 6"'$  (1.676 m)  ?   Head Circumference --   ?   Peak Flow --   ?   Pain Score 12/23/21 1042 10  ?   Pain Loc --   ?   Pain Edu? --   ?   Excl. in [place]? --   ? ? ?Most recent vital signs: ?Vitals:  ? 12/23/21 1044  ?BP: (!) 104/55  ?Pulse: 71  ?Resp: 19  ?Temp: 98.2 ?F (36.8 ?C)  ?SpO2: 100%  ? ? ?General: Awake, NAD.  ?Skin: Warm, dry. No rashes or lesions.  ?Eyes: PERRL. Conjunctivae normal.  ?CV: Good peripheral perfusion.  ?Resp: Normal effort.  ?Abd: Soft, non-tender. No distention.  ?Neuro: At baseline. No gross neurological deficits.  ? ?Focused Exam: Tenderness when palpating the left shoulder, as well as the proximal humerus.  Limited range of motion due to the pain.  Pulse, motor, sensation intact distally.  5/5 grip strength. ? ?Physical Exam ? ? ? ?ED Results / Procedures / Treatments  ?Labs ?(all labs ordered are listed,  but only abnormal results are displayed) ?Labs Reviewed - No data to display ? ? ?EKG ?Not applicable.   ? ? ?RADIOLOGY ? ?ED Provider Interpretation: I personally reviewed these x-rays, nondisplaced humeral neck fracture based on my interpretation ? ?DG Shoulder Left ? ?Result Date: 12/23/2021 ?CLINICAL DATA:  Fall yesterday.  Left shoulder pain. EXAM: LEFT SHOULDER - 2+ VIEW COMPARISON:  None. FINDINGS: A nondisplaced fracture of the left humeral neck is seen. No evidence of shoulder dislocation. IMPRESSION: Nondisplaced left humeral neck fracture. Electronically Signed   By: Marlaine Hind M.D.   On: 12/23/2021 11:31  ? ?DG Humerus Left ? ?Result Date: 12/23/2021 ?CLINICAL DATA:  Fall.  Left upper arm pain. EXAM: LEFT HUMERUS - 2+ VIEW COMPARISON:  Left shoulder radiographs also obtained today FINDINGS: Nondisplaced left humeral neck fracture is better visualized on dedicated shoulder radiographs obtained today. No other fracture of humerus identified. IMPRESSION: Nondisplaced left humeral neck fracture. No other fracture identified. Electronically Signed   By: Marlaine Hind M.D.   On: 12/23/2021 11:33   ? ?PROCEDURES: ? ?Critical Care performed: None. ? ?Procedures ? ? ? ?MEDICATIONS ORDERED IN ED: ?Medications  ?HYDROcodone-acetaminophen (  NORCO/VICODIN) 5-325 MG per tablet 1 tablet (1 tablet Oral Given 12/23/21 1216)  ? ? ? ?IMPRESSION / MDM / ASSESSMENT AND PLAN / ED COURSE  ?I reviewed the triage vital signs and the nursing notes. ?             ?               ? ?Differential diagnosis includes, but is not limited to, humerus fracture, shoulder dislocation, rotator cuff injury, labrum tear, musculoskeletal strain. ? ?ED Course ?Patient appears well, vitals within normal limits.  NAD.  Patient states that she is already taken some of her own tramadol. ? ? ?Assessment/Plan ?Patient presents with mechanical fall, found to have nondisplaced humeral neck fracture on the left side.  Currently stable at this time.  She is  neurovascularly intact.  Applied a coaptation splint along the left upper extremity and placed in a sling.  Advised her to follow-up with orthopedics this week for further evaluation and management.  We will provide her with a short-term prescription for hydrocodone/acetaminophen to be used as needed. ? ?Provided the patient with anticipatory guidance, return precautions, and educational material. Encouraged the patient to return to the emergency department at any time if they begin to experience any new or worsening symptoms. Patient expressed understanding and agreed with the plan.  ? ?  ? ? ?FINAL CLINICAL IMPRESSION(S) / ED DIAGNOSES  ? ?Final diagnoses:  ?Closed nondisplaced fracture of surgical neck of left humerus, unspecified fracture morphology, initial encounter  ? ? ? ?Rx / DC Orders  ? ?ED Discharge Orders   ? ?      Ordered  ?  HYDROcodone-acetaminophen (NORCO/VICODIN) 5-325 MG tablet  Every 4 hours PRN,   Status:  Discontinued       ? 12/23/21 1230  ?  oxyCODONE-acetaminophen (PERCOCET/ROXICET) 5-325 MG tablet  Every 6 hours PRN       ? 12/23/21 1249  ? ?  ?  ? ?  ? ? ? ?Note:  This document was prepared using Dragon voice recognition software and may include unintentional dictation errors. ?  ?Teodoro Spray, [place] ?12/23/21 1615 ? ?  ?Rada Hay, MD ?12/24/21 1103 ? ?

## 2021-12-25 DIAGNOSIS — I890 Lymphedema, not elsewhere classified: Secondary | ICD-10-CM | POA: Diagnosis not present

## 2021-12-25 DIAGNOSIS — M6281 Muscle weakness (generalized): Secondary | ICD-10-CM | POA: Diagnosis not present

## 2021-12-25 DIAGNOSIS — R2689 Other abnormalities of gait and mobility: Secondary | ICD-10-CM | POA: Diagnosis not present

## 2021-12-25 DIAGNOSIS — L905 Scar conditions and fibrosis of skin: Secondary | ICD-10-CM | POA: Diagnosis not present

## 2021-12-27 DIAGNOSIS — W1839XD Other fall on same level, subsequent encounter: Secondary | ICD-10-CM | POA: Diagnosis not present

## 2021-12-27 DIAGNOSIS — S42202A Unspecified fracture of upper end of left humerus, initial encounter for closed fracture: Secondary | ICD-10-CM | POA: Diagnosis not present

## 2021-12-27 DIAGNOSIS — W19XXXA Unspecified fall, initial encounter: Secondary | ICD-10-CM | POA: Diagnosis not present

## 2021-12-27 DIAGNOSIS — S42202D Unspecified fracture of upper end of left humerus, subsequent encounter for fracture with routine healing: Secondary | ICD-10-CM | POA: Diagnosis not present

## 2021-12-28 DIAGNOSIS — M6281 Muscle weakness (generalized): Secondary | ICD-10-CM | POA: Diagnosis not present

## 2021-12-28 DIAGNOSIS — I890 Lymphedema, not elsewhere classified: Secondary | ICD-10-CM | POA: Diagnosis not present

## 2021-12-28 DIAGNOSIS — L905 Scar conditions and fibrosis of skin: Secondary | ICD-10-CM | POA: Diagnosis not present

## 2021-12-28 DIAGNOSIS — R2689 Other abnormalities of gait and mobility: Secondary | ICD-10-CM | POA: Diagnosis not present

## 2021-12-31 DIAGNOSIS — C499 Malignant neoplasm of connective and soft tissue, unspecified: Secondary | ICD-10-CM | POA: Diagnosis not present

## 2021-12-31 DIAGNOSIS — C4922 Malignant neoplasm of connective and soft tissue of left lower limb, including hip: Secondary | ICD-10-CM | POA: Diagnosis not present

## 2021-12-31 DIAGNOSIS — Z9889 Other specified postprocedural states: Secondary | ICD-10-CM | POA: Diagnosis not present

## 2022-01-01 DIAGNOSIS — R2689 Other abnormalities of gait and mobility: Secondary | ICD-10-CM | POA: Diagnosis not present

## 2022-01-01 DIAGNOSIS — L905 Scar conditions and fibrosis of skin: Secondary | ICD-10-CM | POA: Diagnosis not present

## 2022-01-01 DIAGNOSIS — M6281 Muscle weakness (generalized): Secondary | ICD-10-CM | POA: Diagnosis not present

## 2022-01-01 DIAGNOSIS — M25512 Pain in left shoulder: Secondary | ICD-10-CM | POA: Diagnosis not present

## 2022-01-01 DIAGNOSIS — G8911 Acute pain due to trauma: Secondary | ICD-10-CM | POA: Diagnosis not present

## 2022-01-01 DIAGNOSIS — I890 Lymphedema, not elsewhere classified: Secondary | ICD-10-CM | POA: Diagnosis not present

## 2022-01-01 DIAGNOSIS — M25612 Stiffness of left shoulder, not elsewhere classified: Secondary | ICD-10-CM | POA: Diagnosis not present

## 2022-01-03 DIAGNOSIS — M25612 Stiffness of left shoulder, not elsewhere classified: Secondary | ICD-10-CM | POA: Diagnosis not present

## 2022-01-03 DIAGNOSIS — X58XXXA Exposure to other specified factors, initial encounter: Secondary | ICD-10-CM | POA: Diagnosis not present

## 2022-01-03 DIAGNOSIS — G8911 Acute pain due to trauma: Secondary | ICD-10-CM | POA: Diagnosis not present

## 2022-01-03 DIAGNOSIS — M25512 Pain in left shoulder: Secondary | ICD-10-CM | POA: Diagnosis not present

## 2022-01-03 DIAGNOSIS — R0602 Shortness of breath: Secondary | ICD-10-CM | POA: Diagnosis not present

## 2022-01-03 DIAGNOSIS — S42212D Unspecified displaced fracture of surgical neck of left humerus, subsequent encounter for fracture with routine healing: Secondary | ICD-10-CM | POA: Diagnosis not present

## 2022-01-03 DIAGNOSIS — L905 Scar conditions and fibrosis of skin: Secondary | ICD-10-CM | POA: Diagnosis not present

## 2022-01-03 DIAGNOSIS — W1830XA Fall on same level, unspecified, initial encounter: Secondary | ICD-10-CM | POA: Diagnosis not present

## 2022-01-03 DIAGNOSIS — Z79899 Other long term (current) drug therapy: Secondary | ICD-10-CM | POA: Diagnosis not present

## 2022-01-03 DIAGNOSIS — C7801 Secondary malignant neoplasm of right lung: Secondary | ICD-10-CM | POA: Diagnosis not present

## 2022-01-03 DIAGNOSIS — Z923 Personal history of irradiation: Secondary | ICD-10-CM | POA: Diagnosis not present

## 2022-01-03 DIAGNOSIS — Z9049 Acquired absence of other specified parts of digestive tract: Secondary | ICD-10-CM | POA: Diagnosis not present

## 2022-01-03 DIAGNOSIS — C499 Malignant neoplasm of connective and soft tissue, unspecified: Secondary | ICD-10-CM | POA: Diagnosis not present

## 2022-01-03 DIAGNOSIS — S42215A Unspecified nondisplaced fracture of surgical neck of left humerus, initial encounter for closed fracture: Secondary | ICD-10-CM | POA: Diagnosis not present

## 2022-01-03 DIAGNOSIS — I890 Lymphedema, not elsewhere classified: Secondary | ICD-10-CM | POA: Diagnosis not present

## 2022-01-03 DIAGNOSIS — C7652 Malignant neoplasm of left lower limb: Secondary | ICD-10-CM | POA: Diagnosis not present

## 2022-01-03 DIAGNOSIS — I82441 Acute embolism and thrombosis of right tibial vein: Secondary | ICD-10-CM | POA: Diagnosis not present

## 2022-01-03 DIAGNOSIS — E890 Postprocedural hypothyroidism: Secondary | ICD-10-CM | POA: Diagnosis not present

## 2022-01-03 DIAGNOSIS — Z9109 Other allergy status, other than to drugs and biological substances: Secondary | ICD-10-CM | POA: Diagnosis not present

## 2022-01-03 DIAGNOSIS — G893 Neoplasm related pain (acute) (chronic): Secondary | ICD-10-CM | POA: Diagnosis not present

## 2022-01-03 DIAGNOSIS — Z86718 Personal history of other venous thrombosis and embolism: Secondary | ICD-10-CM | POA: Diagnosis not present

## 2022-01-03 DIAGNOSIS — Z881 Allergy status to other antibiotic agents status: Secondary | ICD-10-CM | POA: Diagnosis not present

## 2022-01-03 DIAGNOSIS — W19XXXD Unspecified fall, subsequent encounter: Secondary | ICD-10-CM | POA: Diagnosis not present

## 2022-01-03 DIAGNOSIS — G8918 Other acute postprocedural pain: Secondary | ICD-10-CM | POA: Diagnosis not present

## 2022-01-03 DIAGNOSIS — F40240 Claustrophobia: Secondary | ICD-10-CM | POA: Diagnosis not present

## 2022-01-03 DIAGNOSIS — G4700 Insomnia, unspecified: Secondary | ICD-10-CM | POA: Diagnosis not present

## 2022-01-03 DIAGNOSIS — Z5986 Financial insecurity: Secondary | ICD-10-CM | POA: Diagnosis not present

## 2022-01-03 DIAGNOSIS — K5900 Constipation, unspecified: Secondary | ICD-10-CM | POA: Diagnosis not present

## 2022-01-03 DIAGNOSIS — M6281 Muscle weakness (generalized): Secondary | ICD-10-CM | POA: Diagnosis not present

## 2022-01-03 DIAGNOSIS — R0789 Other chest pain: Secondary | ICD-10-CM | POA: Diagnosis not present

## 2022-01-03 DIAGNOSIS — R1030 Lower abdominal pain, unspecified: Secondary | ICD-10-CM | POA: Diagnosis not present

## 2022-01-03 DIAGNOSIS — Z7901 Long term (current) use of anticoagulants: Secondary | ICD-10-CM | POA: Diagnosis not present

## 2022-01-03 DIAGNOSIS — I2694 Multiple subsegmental pulmonary emboli without acute cor pulmonale: Secondary | ICD-10-CM | POA: Diagnosis not present

## 2022-01-03 DIAGNOSIS — R2689 Other abnormalities of gait and mobility: Secondary | ICD-10-CM | POA: Diagnosis not present

## 2022-01-03 DIAGNOSIS — Z7189 Other specified counseling: Secondary | ICD-10-CM | POA: Diagnosis not present

## 2022-01-03 DIAGNOSIS — Z515 Encounter for palliative care: Secondary | ICD-10-CM | POA: Diagnosis not present

## 2022-01-03 DIAGNOSIS — S32509A Unspecified fracture of unspecified pubis, initial encounter for closed fracture: Secondary | ICD-10-CM | POA: Diagnosis not present

## 2022-01-03 DIAGNOSIS — M1612 Unilateral primary osteoarthritis, left hip: Secondary | ICD-10-CM | POA: Diagnosis not present

## 2022-01-03 DIAGNOSIS — W19XXXA Unspecified fall, initial encounter: Secondary | ICD-10-CM | POA: Diagnosis not present

## 2022-01-03 DIAGNOSIS — R9389 Abnormal findings on diagnostic imaging of other specified body structures: Secondary | ICD-10-CM | POA: Diagnosis not present

## 2022-01-03 DIAGNOSIS — I2699 Other pulmonary embolism without acute cor pulmonale: Secondary | ICD-10-CM | POA: Diagnosis not present

## 2022-01-03 DIAGNOSIS — Z9884 Bariatric surgery status: Secondary | ICD-10-CM | POA: Diagnosis not present

## 2022-01-03 DIAGNOSIS — R918 Other nonspecific abnormal finding of lung field: Secondary | ICD-10-CM | POA: Diagnosis not present

## 2022-01-03 DIAGNOSIS — C7802 Secondary malignant neoplasm of left lung: Secondary | ICD-10-CM | POA: Diagnosis not present

## 2022-01-03 DIAGNOSIS — Z736 Limitation of activities due to disability: Secondary | ICD-10-CM | POA: Diagnosis not present

## 2022-01-03 DIAGNOSIS — Z889 Allergy status to unspecified drugs, medicaments and biological substances status: Secondary | ICD-10-CM | POA: Diagnosis not present

## 2022-01-03 DIAGNOSIS — S32501D Unspecified fracture of right pubis, subsequent encounter for fracture with routine healing: Secondary | ICD-10-CM | POA: Diagnosis not present

## 2022-01-09 DIAGNOSIS — C499 Malignant neoplasm of connective and soft tissue, unspecified: Secondary | ICD-10-CM | POA: Diagnosis not present

## 2022-01-09 DIAGNOSIS — D62 Acute posthemorrhagic anemia: Secondary | ICD-10-CM | POA: Diagnosis not present

## 2022-01-09 DIAGNOSIS — R112 Nausea with vomiting, unspecified: Secondary | ICD-10-CM | POA: Diagnosis not present

## 2022-01-09 DIAGNOSIS — I2699 Other pulmonary embolism without acute cor pulmonale: Secondary | ICD-10-CM | POA: Diagnosis not present

## 2022-01-13 DIAGNOSIS — I890 Lymphedema, not elsewhere classified: Secondary | ICD-10-CM | POA: Diagnosis not present

## 2022-01-19 DIAGNOSIS — C499 Malignant neoplasm of connective and soft tissue, unspecified: Secondary | ICD-10-CM | POA: Diagnosis not present

## 2022-01-19 DIAGNOSIS — I517 Cardiomegaly: Secondary | ICD-10-CM | POA: Diagnosis not present

## 2022-01-22 DIAGNOSIS — R112 Nausea with vomiting, unspecified: Secondary | ICD-10-CM | POA: Diagnosis not present

## 2022-01-22 DIAGNOSIS — D62 Acute posthemorrhagic anemia: Secondary | ICD-10-CM | POA: Diagnosis not present

## 2022-01-22 DIAGNOSIS — Z79899 Other long term (current) drug therapy: Secondary | ICD-10-CM | POA: Diagnosis not present

## 2022-01-22 DIAGNOSIS — M7989 Other specified soft tissue disorders: Secondary | ICD-10-CM | POA: Diagnosis not present

## 2022-01-22 DIAGNOSIS — C499 Malignant neoplasm of connective and soft tissue, unspecified: Secondary | ICD-10-CM | POA: Diagnosis not present

## 2022-01-22 DIAGNOSIS — I2699 Other pulmonary embolism without acute cor pulmonale: Secondary | ICD-10-CM | POA: Diagnosis not present

## 2022-01-22 DIAGNOSIS — G8918 Other acute postprocedural pain: Secondary | ICD-10-CM | POA: Diagnosis not present

## 2022-01-22 DIAGNOSIS — Z5111 Encounter for antineoplastic chemotherapy: Secondary | ICD-10-CM | POA: Diagnosis not present

## 2022-01-24 DIAGNOSIS — W010XXD Fall on same level from slipping, tripping and stumbling without subsequent striking against object, subsequent encounter: Secondary | ICD-10-CM | POA: Diagnosis not present

## 2022-01-24 DIAGNOSIS — W010XXA Fall on same level from slipping, tripping and stumbling without subsequent striking against object, initial encounter: Secondary | ICD-10-CM | POA: Diagnosis not present

## 2022-01-24 DIAGNOSIS — X58XXXA Exposure to other specified factors, initial encounter: Secondary | ICD-10-CM | POA: Diagnosis not present

## 2022-01-24 DIAGNOSIS — C7802 Secondary malignant neoplasm of left lung: Secondary | ICD-10-CM | POA: Diagnosis not present

## 2022-01-24 DIAGNOSIS — S42202D Unspecified fracture of upper end of left humerus, subsequent encounter for fracture with routine healing: Secondary | ICD-10-CM | POA: Diagnosis not present

## 2022-01-24 DIAGNOSIS — S42202A Unspecified fracture of upper end of left humerus, initial encounter for closed fracture: Secondary | ICD-10-CM | POA: Diagnosis not present

## 2022-02-01 DIAGNOSIS — G8911 Acute pain due to trauma: Secondary | ICD-10-CM | POA: Diagnosis not present

## 2022-02-01 DIAGNOSIS — I890 Lymphedema, not elsewhere classified: Secondary | ICD-10-CM | POA: Diagnosis not present

## 2022-02-01 DIAGNOSIS — M6281 Muscle weakness (generalized): Secondary | ICD-10-CM | POA: Diagnosis not present

## 2022-02-01 DIAGNOSIS — M25512 Pain in left shoulder: Secondary | ICD-10-CM | POA: Diagnosis not present

## 2022-02-01 DIAGNOSIS — M25612 Stiffness of left shoulder, not elsewhere classified: Secondary | ICD-10-CM | POA: Diagnosis not present

## 2022-02-01 DIAGNOSIS — R2689 Other abnormalities of gait and mobility: Secondary | ICD-10-CM | POA: Diagnosis not present

## 2022-02-01 DIAGNOSIS — L905 Scar conditions and fibrosis of skin: Secondary | ICD-10-CM | POA: Diagnosis not present

## 2022-02-05 DIAGNOSIS — M6281 Muscle weakness (generalized): Secondary | ICD-10-CM | POA: Diagnosis not present

## 2022-02-05 DIAGNOSIS — R2689 Other abnormalities of gait and mobility: Secondary | ICD-10-CM | POA: Diagnosis not present

## 2022-02-05 DIAGNOSIS — M25612 Stiffness of left shoulder, not elsewhere classified: Secondary | ICD-10-CM | POA: Diagnosis not present

## 2022-02-05 DIAGNOSIS — L905 Scar conditions and fibrosis of skin: Secondary | ICD-10-CM | POA: Diagnosis not present

## 2022-02-05 DIAGNOSIS — G8911 Acute pain due to trauma: Secondary | ICD-10-CM | POA: Diagnosis not present

## 2022-02-05 DIAGNOSIS — M25512 Pain in left shoulder: Secondary | ICD-10-CM | POA: Diagnosis not present

## 2022-02-05 DIAGNOSIS — I890 Lymphedema, not elsewhere classified: Secondary | ICD-10-CM | POA: Diagnosis not present

## 2022-02-08 DIAGNOSIS — I890 Lymphedema, not elsewhere classified: Secondary | ICD-10-CM | POA: Diagnosis not present

## 2022-02-08 DIAGNOSIS — G8911 Acute pain due to trauma: Secondary | ICD-10-CM | POA: Diagnosis not present

## 2022-02-08 DIAGNOSIS — L905 Scar conditions and fibrosis of skin: Secondary | ICD-10-CM | POA: Diagnosis not present

## 2022-02-08 DIAGNOSIS — M25512 Pain in left shoulder: Secondary | ICD-10-CM | POA: Diagnosis not present

## 2022-02-08 DIAGNOSIS — R2689 Other abnormalities of gait and mobility: Secondary | ICD-10-CM | POA: Diagnosis not present

## 2022-02-08 DIAGNOSIS — M25612 Stiffness of left shoulder, not elsewhere classified: Secondary | ICD-10-CM | POA: Diagnosis not present

## 2022-02-08 DIAGNOSIS — M6281 Muscle weakness (generalized): Secondary | ICD-10-CM | POA: Diagnosis not present

## 2022-02-13 DIAGNOSIS — Z803 Family history of malignant neoplasm of breast: Secondary | ICD-10-CM | POA: Diagnosis not present

## 2022-02-13 DIAGNOSIS — Z79899 Other long term (current) drug therapy: Secondary | ICD-10-CM | POA: Diagnosis not present

## 2022-02-13 DIAGNOSIS — X58XXXA Exposure to other specified factors, initial encounter: Secondary | ICD-10-CM | POA: Diagnosis not present

## 2022-02-13 DIAGNOSIS — I890 Lymphedema, not elsewhere classified: Secondary | ICD-10-CM | POA: Diagnosis not present

## 2022-02-13 DIAGNOSIS — Z800 Family history of malignant neoplasm of digestive organs: Secondary | ICD-10-CM | POA: Diagnosis not present

## 2022-02-13 DIAGNOSIS — T451X5A Adverse effect of antineoplastic and immunosuppressive drugs, initial encounter: Secondary | ICD-10-CM | POA: Diagnosis not present

## 2022-02-13 DIAGNOSIS — C7802 Secondary malignant neoplasm of left lung: Secondary | ICD-10-CM | POA: Diagnosis not present

## 2022-02-13 DIAGNOSIS — C499 Malignant neoplasm of connective and soft tissue, unspecified: Secondary | ICD-10-CM | POA: Diagnosis not present

## 2022-02-13 DIAGNOSIS — R600 Localized edema: Secondary | ICD-10-CM | POA: Diagnosis not present

## 2022-02-13 DIAGNOSIS — C7801 Secondary malignant neoplasm of right lung: Secondary | ICD-10-CM | POA: Diagnosis not present

## 2022-02-13 DIAGNOSIS — C495 Malignant neoplasm of connective and soft tissue of pelvis: Secondary | ICD-10-CM | POA: Diagnosis not present

## 2022-02-13 DIAGNOSIS — Z7901 Long term (current) use of anticoagulants: Secondary | ICD-10-CM | POA: Diagnosis not present

## 2022-02-13 DIAGNOSIS — R5383 Other fatigue: Secondary | ICD-10-CM | POA: Diagnosis not present

## 2022-02-13 DIAGNOSIS — I2699 Other pulmonary embolism without acute cor pulmonale: Secondary | ICD-10-CM | POA: Diagnosis not present

## 2022-02-13 DIAGNOSIS — C7989 Secondary malignant neoplasm of other specified sites: Secondary | ICD-10-CM | POA: Diagnosis not present

## 2022-02-13 DIAGNOSIS — D701 Agranulocytosis secondary to cancer chemotherapy: Secondary | ICD-10-CM | POA: Diagnosis not present

## 2022-02-13 DIAGNOSIS — Z8051 Family history of malignant neoplasm of kidney: Secondary | ICD-10-CM | POA: Diagnosis not present

## 2022-02-13 DIAGNOSIS — S42292A Other displaced fracture of upper end of left humerus, initial encounter for closed fracture: Secondary | ICD-10-CM | POA: Diagnosis not present

## 2022-02-20 DIAGNOSIS — Z5111 Encounter for antineoplastic chemotherapy: Secondary | ICD-10-CM | POA: Diagnosis not present

## 2022-02-20 DIAGNOSIS — R69 Illness, unspecified: Secondary | ICD-10-CM | POA: Diagnosis not present

## 2022-02-20 DIAGNOSIS — C7989 Secondary malignant neoplasm of other specified sites: Secondary | ICD-10-CM | POA: Diagnosis not present

## 2022-02-20 DIAGNOSIS — C499 Malignant neoplasm of connective and soft tissue, unspecified: Secondary | ICD-10-CM | POA: Diagnosis not present

## 2022-03-07 DIAGNOSIS — G8911 Acute pain due to trauma: Secondary | ICD-10-CM | POA: Diagnosis not present

## 2022-03-07 DIAGNOSIS — M6281 Muscle weakness (generalized): Secondary | ICD-10-CM | POA: Diagnosis not present

## 2022-03-07 DIAGNOSIS — L905 Scar conditions and fibrosis of skin: Secondary | ICD-10-CM | POA: Diagnosis not present

## 2022-03-07 DIAGNOSIS — I890 Lymphedema, not elsewhere classified: Secondary | ICD-10-CM | POA: Diagnosis not present

## 2022-03-07 DIAGNOSIS — R2689 Other abnormalities of gait and mobility: Secondary | ICD-10-CM | POA: Diagnosis not present

## 2022-03-07 DIAGNOSIS — M25612 Stiffness of left shoulder, not elsewhere classified: Secondary | ICD-10-CM | POA: Diagnosis not present

## 2022-03-07 DIAGNOSIS — M25512 Pain in left shoulder: Secondary | ICD-10-CM | POA: Diagnosis not present

## 2022-03-11 DIAGNOSIS — C7802 Secondary malignant neoplasm of left lung: Secondary | ICD-10-CM | POA: Diagnosis not present

## 2022-03-11 DIAGNOSIS — C7801 Secondary malignant neoplasm of right lung: Secondary | ICD-10-CM | POA: Diagnosis not present

## 2022-03-11 DIAGNOSIS — I2699 Other pulmonary embolism without acute cor pulmonale: Secondary | ICD-10-CM | POA: Diagnosis not present

## 2022-03-11 DIAGNOSIS — C495 Malignant neoplasm of connective and soft tissue of pelvis: Secondary | ICD-10-CM | POA: Diagnosis not present

## 2022-03-15 DIAGNOSIS — I890 Lymphedema, not elsewhere classified: Secondary | ICD-10-CM | POA: Diagnosis not present

## 2022-03-16 DIAGNOSIS — L905 Scar conditions and fibrosis of skin: Secondary | ICD-10-CM | POA: Diagnosis not present

## 2022-03-16 DIAGNOSIS — R2689 Other abnormalities of gait and mobility: Secondary | ICD-10-CM | POA: Diagnosis not present

## 2022-03-16 DIAGNOSIS — M25512 Pain in left shoulder: Secondary | ICD-10-CM | POA: Diagnosis not present

## 2022-03-16 DIAGNOSIS — M25612 Stiffness of left shoulder, not elsewhere classified: Secondary | ICD-10-CM | POA: Diagnosis not present

## 2022-03-16 DIAGNOSIS — G8911 Acute pain due to trauma: Secondary | ICD-10-CM | POA: Diagnosis not present

## 2022-03-16 DIAGNOSIS — M6281 Muscle weakness (generalized): Secondary | ICD-10-CM | POA: Diagnosis not present

## 2022-03-16 DIAGNOSIS — I890 Lymphedema, not elsewhere classified: Secondary | ICD-10-CM | POA: Diagnosis not present

## 2022-03-20 DIAGNOSIS — C499 Malignant neoplasm of connective and soft tissue, unspecified: Secondary | ICD-10-CM | POA: Diagnosis not present

## 2022-03-20 DIAGNOSIS — N760 Acute vaginitis: Secondary | ICD-10-CM | POA: Diagnosis not present

## 2022-03-20 DIAGNOSIS — C7801 Secondary malignant neoplasm of right lung: Secondary | ICD-10-CM | POA: Diagnosis not present

## 2022-03-20 DIAGNOSIS — C7802 Secondary malignant neoplasm of left lung: Secondary | ICD-10-CM | POA: Diagnosis not present

## 2022-03-20 DIAGNOSIS — Z5111 Encounter for antineoplastic chemotherapy: Secondary | ICD-10-CM | POA: Diagnosis not present

## 2022-03-20 DIAGNOSIS — M7989 Other specified soft tissue disorders: Secondary | ICD-10-CM | POA: Diagnosis not present

## 2022-03-20 DIAGNOSIS — R300 Dysuria: Secondary | ICD-10-CM | POA: Diagnosis not present

## 2022-03-20 DIAGNOSIS — K1231 Oral mucositis (ulcerative) due to antineoplastic therapy: Secondary | ICD-10-CM | POA: Diagnosis not present

## 2022-04-03 DIAGNOSIS — M7989 Other specified soft tissue disorders: Secondary | ICD-10-CM | POA: Diagnosis not present

## 2022-04-03 DIAGNOSIS — C7801 Secondary malignant neoplasm of right lung: Secondary | ICD-10-CM | POA: Diagnosis not present

## 2022-04-03 DIAGNOSIS — K1231 Oral mucositis (ulcerative) due to antineoplastic therapy: Secondary | ICD-10-CM | POA: Diagnosis not present

## 2022-04-03 DIAGNOSIS — W010XXD Fall on same level from slipping, tripping and stumbling without subsequent striking against object, subsequent encounter: Secondary | ICD-10-CM | POA: Diagnosis not present

## 2022-04-03 DIAGNOSIS — N760 Acute vaginitis: Secondary | ICD-10-CM | POA: Diagnosis not present

## 2022-04-03 DIAGNOSIS — I2782 Chronic pulmonary embolism: Secondary | ICD-10-CM | POA: Diagnosis not present

## 2022-04-03 DIAGNOSIS — S42215D Unspecified nondisplaced fracture of surgical neck of left humerus, subsequent encounter for fracture with routine healing: Secondary | ICD-10-CM | POA: Diagnosis not present

## 2022-04-03 DIAGNOSIS — C499 Malignant neoplasm of connective and soft tissue, unspecified: Secondary | ICD-10-CM | POA: Diagnosis not present

## 2022-04-03 DIAGNOSIS — Z5111 Encounter for antineoplastic chemotherapy: Secondary | ICD-10-CM | POA: Diagnosis not present

## 2022-04-03 DIAGNOSIS — C7802 Secondary malignant neoplasm of left lung: Secondary | ICD-10-CM | POA: Diagnosis not present

## 2022-04-10 DIAGNOSIS — C7802 Secondary malignant neoplasm of left lung: Secondary | ICD-10-CM | POA: Diagnosis not present

## 2022-04-10 DIAGNOSIS — C499 Malignant neoplasm of connective and soft tissue, unspecified: Secondary | ICD-10-CM | POA: Diagnosis not present

## 2022-04-10 DIAGNOSIS — C7801 Secondary malignant neoplasm of right lung: Secondary | ICD-10-CM | POA: Diagnosis not present

## 2022-04-10 DIAGNOSIS — Z5111 Encounter for antineoplastic chemotherapy: Secondary | ICD-10-CM | POA: Diagnosis not present

## 2022-04-15 DIAGNOSIS — I890 Lymphedema, not elsewhere classified: Secondary | ICD-10-CM | POA: Diagnosis not present

## 2022-04-24 DIAGNOSIS — I2699 Other pulmonary embolism without acute cor pulmonale: Secondary | ICD-10-CM | POA: Diagnosis not present

## 2022-04-24 DIAGNOSIS — C7801 Secondary malignant neoplasm of right lung: Secondary | ICD-10-CM | POA: Diagnosis not present

## 2022-04-24 DIAGNOSIS — R918 Other nonspecific abnormal finding of lung field: Secondary | ICD-10-CM | POA: Diagnosis not present

## 2022-04-24 DIAGNOSIS — I3139 Other pericardial effusion (noninflammatory): Secondary | ICD-10-CM | POA: Diagnosis not present

## 2022-04-24 DIAGNOSIS — C7802 Secondary malignant neoplasm of left lung: Secondary | ICD-10-CM | POA: Diagnosis not present

## 2022-04-24 DIAGNOSIS — C499 Malignant neoplasm of connective and soft tissue, unspecified: Secondary | ICD-10-CM | POA: Diagnosis not present

## 2022-04-24 DIAGNOSIS — Z79899 Other long term (current) drug therapy: Secondary | ICD-10-CM | POA: Diagnosis not present

## 2022-05-15 DIAGNOSIS — C7802 Secondary malignant neoplasm of left lung: Secondary | ICD-10-CM | POA: Diagnosis not present

## 2022-05-15 DIAGNOSIS — C7801 Secondary malignant neoplasm of right lung: Secondary | ICD-10-CM | POA: Diagnosis not present

## 2022-05-15 DIAGNOSIS — C499 Malignant neoplasm of connective and soft tissue, unspecified: Secondary | ICD-10-CM | POA: Diagnosis not present

## 2022-05-15 DIAGNOSIS — Z5111 Encounter for antineoplastic chemotherapy: Secondary | ICD-10-CM | POA: Diagnosis not present

## 2022-05-15 DIAGNOSIS — M7989 Other specified soft tissue disorders: Secondary | ICD-10-CM | POA: Diagnosis not present

## 2022-05-16 DIAGNOSIS — I890 Lymphedema, not elsewhere classified: Secondary | ICD-10-CM | POA: Diagnosis not present

## 2022-05-18 DIAGNOSIS — M7989 Other specified soft tissue disorders: Secondary | ICD-10-CM | POA: Diagnosis not present

## 2022-05-22 DIAGNOSIS — R609 Edema, unspecified: Secondary | ICD-10-CM | POA: Diagnosis not present

## 2022-05-22 DIAGNOSIS — C499 Malignant neoplasm of connective and soft tissue, unspecified: Secondary | ICD-10-CM | POA: Diagnosis not present

## 2022-05-22 DIAGNOSIS — T8189XD Other complications of procedures, not elsewhere classified, subsequent encounter: Secondary | ICD-10-CM | POA: Diagnosis not present

## 2022-06-05 DIAGNOSIS — Z79899 Other long term (current) drug therapy: Secondary | ICD-10-CM | POA: Diagnosis not present

## 2022-06-05 DIAGNOSIS — I2699 Other pulmonary embolism without acute cor pulmonale: Secondary | ICD-10-CM | POA: Diagnosis not present

## 2022-06-05 DIAGNOSIS — C499 Malignant neoplasm of connective and soft tissue, unspecified: Secondary | ICD-10-CM | POA: Diagnosis not present

## 2022-06-05 DIAGNOSIS — T451X5A Adverse effect of antineoplastic and immunosuppressive drugs, initial encounter: Secondary | ICD-10-CM | POA: Diagnosis not present

## 2022-06-05 DIAGNOSIS — C7801 Secondary malignant neoplasm of right lung: Secondary | ICD-10-CM | POA: Diagnosis not present

## 2022-06-05 DIAGNOSIS — R112 Nausea with vomiting, unspecified: Secondary | ICD-10-CM | POA: Diagnosis not present

## 2022-06-05 DIAGNOSIS — I3139 Other pericardial effusion (noninflammatory): Secondary | ICD-10-CM | POA: Diagnosis not present

## 2022-06-05 DIAGNOSIS — C7802 Secondary malignant neoplasm of left lung: Secondary | ICD-10-CM | POA: Diagnosis not present

## 2022-06-15 DIAGNOSIS — I890 Lymphedema, not elsewhere classified: Secondary | ICD-10-CM | POA: Diagnosis not present

## 2022-06-26 DIAGNOSIS — C7802 Secondary malignant neoplasm of left lung: Secondary | ICD-10-CM | POA: Diagnosis not present

## 2022-06-26 DIAGNOSIS — C7801 Secondary malignant neoplasm of right lung: Secondary | ICD-10-CM | POA: Diagnosis not present

## 2022-06-26 DIAGNOSIS — M7989 Other specified soft tissue disorders: Secondary | ICD-10-CM | POA: Diagnosis not present

## 2022-06-26 DIAGNOSIS — Z5111 Encounter for antineoplastic chemotherapy: Secondary | ICD-10-CM | POA: Diagnosis not present

## 2022-06-26 DIAGNOSIS — C499 Malignant neoplasm of connective and soft tissue, unspecified: Secondary | ICD-10-CM | POA: Diagnosis not present

## 2022-07-06 DIAGNOSIS — I2699 Other pulmonary embolism without acute cor pulmonale: Secondary | ICD-10-CM | POA: Diagnosis not present

## 2022-07-06 DIAGNOSIS — Z7901 Long term (current) use of anticoagulants: Secondary | ICD-10-CM | POA: Diagnosis not present

## 2022-07-06 DIAGNOSIS — C499 Malignant neoplasm of connective and soft tissue, unspecified: Secondary | ICD-10-CM | POA: Diagnosis not present

## 2022-07-06 DIAGNOSIS — Z01818 Encounter for other preprocedural examination: Secondary | ICD-10-CM | POA: Diagnosis not present

## 2022-07-06 DIAGNOSIS — E039 Hypothyroidism, unspecified: Secondary | ICD-10-CM | POA: Diagnosis not present

## 2022-07-16 DIAGNOSIS — I890 Lymphedema, not elsewhere classified: Secondary | ICD-10-CM | POA: Diagnosis not present

## 2022-07-17 DIAGNOSIS — C499 Malignant neoplasm of connective and soft tissue, unspecified: Secondary | ICD-10-CM | POA: Diagnosis not present

## 2022-07-17 DIAGNOSIS — C7802 Secondary malignant neoplasm of left lung: Secondary | ICD-10-CM | POA: Diagnosis not present

## 2022-07-17 DIAGNOSIS — C7801 Secondary malignant neoplasm of right lung: Secondary | ICD-10-CM | POA: Diagnosis not present

## 2022-07-17 DIAGNOSIS — Z5111 Encounter for antineoplastic chemotherapy: Secondary | ICD-10-CM | POA: Diagnosis not present

## 2022-07-17 DIAGNOSIS — J8489 Other specified interstitial pulmonary diseases: Secondary | ICD-10-CM | POA: Diagnosis not present

## 2022-07-17 DIAGNOSIS — M7989 Other specified soft tissue disorders: Secondary | ICD-10-CM | POA: Diagnosis not present

## 2022-07-17 DIAGNOSIS — I3139 Other pericardial effusion (noninflammatory): Secondary | ICD-10-CM | POA: Diagnosis not present

## 2022-07-20 DIAGNOSIS — C499 Malignant neoplasm of connective and soft tissue, unspecified: Secondary | ICD-10-CM | POA: Diagnosis not present

## 2022-07-20 DIAGNOSIS — I2699 Other pulmonary embolism without acute cor pulmonale: Secondary | ICD-10-CM | POA: Diagnosis not present

## 2022-07-20 DIAGNOSIS — E039 Hypothyroidism, unspecified: Secondary | ICD-10-CM | POA: Diagnosis not present

## 2022-07-20 DIAGNOSIS — D649 Anemia, unspecified: Secondary | ICD-10-CM | POA: Diagnosis not present

## 2022-08-06 DIAGNOSIS — R530 Neoplastic (malignant) related fatigue: Secondary | ICD-10-CM | POA: Diagnosis not present

## 2022-08-06 DIAGNOSIS — Z5189 Encounter for other specified aftercare: Secondary | ICD-10-CM | POA: Diagnosis not present

## 2022-08-06 DIAGNOSIS — I2699 Other pulmonary embolism without acute cor pulmonale: Secondary | ICD-10-CM | POA: Diagnosis not present

## 2022-08-06 DIAGNOSIS — C7802 Secondary malignant neoplasm of left lung: Secondary | ICD-10-CM | POA: Diagnosis not present

## 2022-08-06 DIAGNOSIS — C499 Malignant neoplasm of connective and soft tissue, unspecified: Secondary | ICD-10-CM | POA: Diagnosis not present

## 2022-08-06 DIAGNOSIS — C7801 Secondary malignant neoplasm of right lung: Secondary | ICD-10-CM | POA: Diagnosis not present

## 2022-08-06 DIAGNOSIS — D6481 Anemia due to antineoplastic chemotherapy: Secondary | ICD-10-CM | POA: Diagnosis not present

## 2022-08-06 DIAGNOSIS — Z79899 Other long term (current) drug therapy: Secondary | ICD-10-CM | POA: Diagnosis not present

## 2022-08-06 DIAGNOSIS — R112 Nausea with vomiting, unspecified: Secondary | ICD-10-CM | POA: Diagnosis not present

## 2022-08-06 DIAGNOSIS — T451X5A Adverse effect of antineoplastic and immunosuppressive drugs, initial encounter: Secondary | ICD-10-CM | POA: Diagnosis not present

## 2022-08-06 DIAGNOSIS — Z7901 Long term (current) use of anticoagulants: Secondary | ICD-10-CM | POA: Diagnosis not present

## 2022-08-15 DIAGNOSIS — I890 Lymphedema, not elsewhere classified: Secondary | ICD-10-CM | POA: Diagnosis not present

## 2022-08-21 DIAGNOSIS — Z7901 Long term (current) use of anticoagulants: Secondary | ICD-10-CM | POA: Diagnosis not present

## 2022-08-21 DIAGNOSIS — C7801 Secondary malignant neoplasm of right lung: Secondary | ICD-10-CM | POA: Diagnosis not present

## 2022-08-21 DIAGNOSIS — D500 Iron deficiency anemia secondary to blood loss (chronic): Secondary | ICD-10-CM | POA: Diagnosis not present

## 2022-08-21 DIAGNOSIS — R110 Nausea: Secondary | ICD-10-CM | POA: Diagnosis not present

## 2022-08-21 DIAGNOSIS — Z006 Encounter for examination for normal comparison and control in clinical research program: Secondary | ICD-10-CM | POA: Diagnosis not present

## 2022-08-21 DIAGNOSIS — I82401 Acute embolism and thrombosis of unspecified deep veins of right lower extremity: Secondary | ICD-10-CM | POA: Diagnosis not present

## 2022-08-21 DIAGNOSIS — Z79899 Other long term (current) drug therapy: Secondary | ICD-10-CM | POA: Diagnosis not present

## 2022-08-21 DIAGNOSIS — C7802 Secondary malignant neoplasm of left lung: Secondary | ICD-10-CM | POA: Diagnosis not present

## 2022-08-21 DIAGNOSIS — C4922 Malignant neoplasm of connective and soft tissue of left lower limb, including hip: Secondary | ICD-10-CM | POA: Diagnosis not present

## 2022-08-21 DIAGNOSIS — I2699 Other pulmonary embolism without acute cor pulmonale: Secondary | ICD-10-CM | POA: Diagnosis not present

## 2022-08-28 DIAGNOSIS — Z5112 Encounter for antineoplastic immunotherapy: Secondary | ICD-10-CM | POA: Diagnosis not present

## 2022-08-28 DIAGNOSIS — D649 Anemia, unspecified: Secondary | ICD-10-CM | POA: Diagnosis not present

## 2022-08-28 DIAGNOSIS — C499 Malignant neoplasm of connective and soft tissue, unspecified: Secondary | ICD-10-CM | POA: Diagnosis not present

## 2022-08-28 DIAGNOSIS — R600 Localized edema: Secondary | ICD-10-CM | POA: Diagnosis not present

## 2022-08-28 DIAGNOSIS — Z7901 Long term (current) use of anticoagulants: Secondary | ICD-10-CM | POA: Diagnosis not present

## 2022-08-28 DIAGNOSIS — E039 Hypothyroidism, unspecified: Secondary | ICD-10-CM | POA: Diagnosis not present

## 2022-08-28 DIAGNOSIS — Z86718 Personal history of other venous thrombosis and embolism: Secondary | ICD-10-CM | POA: Diagnosis not present

## 2022-08-28 DIAGNOSIS — R110 Nausea: Secondary | ICD-10-CM | POA: Diagnosis not present

## 2022-08-28 DIAGNOSIS — R059 Cough, unspecified: Secondary | ICD-10-CM | POA: Diagnosis not present

## 2022-08-28 DIAGNOSIS — Z86711 Personal history of pulmonary embolism: Secondary | ICD-10-CM | POA: Diagnosis not present

## 2022-08-28 DIAGNOSIS — C495 Malignant neoplasm of connective and soft tissue of pelvis: Secondary | ICD-10-CM | POA: Diagnosis not present

## 2022-08-28 DIAGNOSIS — Z006 Encounter for examination for normal comparison and control in clinical research program: Secondary | ICD-10-CM | POA: Diagnosis not present

## 2022-08-28 DIAGNOSIS — Z79899 Other long term (current) drug therapy: Secondary | ICD-10-CM | POA: Diagnosis not present

## 2022-09-18 DIAGNOSIS — M7989 Other specified soft tissue disorders: Secondary | ICD-10-CM | POA: Diagnosis not present

## 2022-09-18 DIAGNOSIS — C7802 Secondary malignant neoplasm of left lung: Secondary | ICD-10-CM | POA: Diagnosis not present

## 2022-09-18 DIAGNOSIS — C7801 Secondary malignant neoplasm of right lung: Secondary | ICD-10-CM | POA: Diagnosis not present

## 2022-09-18 DIAGNOSIS — C499 Malignant neoplasm of connective and soft tissue, unspecified: Secondary | ICD-10-CM | POA: Diagnosis not present

## 2022-09-18 DIAGNOSIS — Z006 Encounter for examination for normal comparison and control in clinical research program: Secondary | ICD-10-CM | POA: Diagnosis not present

## 2022-10-01 DIAGNOSIS — E890 Postprocedural hypothyroidism: Secondary | ICD-10-CM | POA: Diagnosis not present

## 2022-10-01 DIAGNOSIS — Z8639 Personal history of other endocrine, nutritional and metabolic disease: Secondary | ICD-10-CM | POA: Diagnosis not present

## 2022-10-08 DIAGNOSIS — C7802 Secondary malignant neoplasm of left lung: Secondary | ICD-10-CM | POA: Diagnosis not present

## 2022-10-08 DIAGNOSIS — Z006 Encounter for examination for normal comparison and control in clinical research program: Secondary | ICD-10-CM | POA: Diagnosis not present

## 2022-10-08 DIAGNOSIS — C499 Malignant neoplasm of connective and soft tissue, unspecified: Secondary | ICD-10-CM | POA: Diagnosis not present

## 2022-10-08 DIAGNOSIS — C7801 Secondary malignant neoplasm of right lung: Secondary | ICD-10-CM | POA: Diagnosis not present

## 2022-10-09 DIAGNOSIS — C499 Malignant neoplasm of connective and soft tissue, unspecified: Secondary | ICD-10-CM | POA: Diagnosis not present

## 2022-10-09 DIAGNOSIS — I2782 Chronic pulmonary embolism: Secondary | ICD-10-CM | POA: Diagnosis not present

## 2022-10-09 DIAGNOSIS — C549 Malignant neoplasm of corpus uteri, unspecified: Secondary | ICD-10-CM | POA: Diagnosis not present

## 2022-10-09 DIAGNOSIS — Z79899 Other long term (current) drug therapy: Secondary | ICD-10-CM | POA: Diagnosis not present

## 2022-10-09 DIAGNOSIS — T451X5A Adverse effect of antineoplastic and immunosuppressive drugs, initial encounter: Secondary | ICD-10-CM | POA: Diagnosis not present

## 2022-10-09 DIAGNOSIS — R112 Nausea with vomiting, unspecified: Secondary | ICD-10-CM | POA: Diagnosis not present

## 2022-10-09 DIAGNOSIS — E039 Hypothyroidism, unspecified: Secondary | ICD-10-CM | POA: Diagnosis not present

## 2022-10-09 DIAGNOSIS — C7801 Secondary malignant neoplasm of right lung: Secondary | ICD-10-CM | POA: Diagnosis not present

## 2022-10-09 DIAGNOSIS — Z7901 Long term (current) use of anticoagulants: Secondary | ICD-10-CM | POA: Diagnosis not present

## 2022-10-09 DIAGNOSIS — C7802 Secondary malignant neoplasm of left lung: Secondary | ICD-10-CM | POA: Diagnosis not present

## 2022-10-09 DIAGNOSIS — R530 Neoplastic (malignant) related fatigue: Secondary | ICD-10-CM | POA: Diagnosis not present

## 2022-10-09 DIAGNOSIS — Z006 Encounter for examination for normal comparison and control in clinical research program: Secondary | ICD-10-CM | POA: Diagnosis not present

## 2022-10-11 DIAGNOSIS — G8929 Other chronic pain: Secondary | ICD-10-CM | POA: Diagnosis not present

## 2022-10-11 DIAGNOSIS — C499 Malignant neoplasm of connective and soft tissue, unspecified: Secondary | ICD-10-CM | POA: Diagnosis not present

## 2022-10-11 DIAGNOSIS — M7989 Other specified soft tissue disorders: Secondary | ICD-10-CM | POA: Diagnosis not present

## 2022-10-11 DIAGNOSIS — S42215G Unspecified nondisplaced fracture of surgical neck of left humerus, subsequent encounter for fracture with delayed healing: Secondary | ICD-10-CM | POA: Diagnosis not present

## 2022-10-11 DIAGNOSIS — M25512 Pain in left shoulder: Secondary | ICD-10-CM | POA: Diagnosis not present

## 2022-10-11 DIAGNOSIS — M25612 Stiffness of left shoulder, not elsewhere classified: Secondary | ICD-10-CM | POA: Diagnosis not present

## 2022-10-11 DIAGNOSIS — I890 Lymphedema, not elsewhere classified: Secondary | ICD-10-CM | POA: Diagnosis not present

## 2022-10-15 DIAGNOSIS — M7989 Other specified soft tissue disorders: Secondary | ICD-10-CM | POA: Diagnosis not present

## 2022-10-15 DIAGNOSIS — S42215G Unspecified nondisplaced fracture of surgical neck of left humerus, subsequent encounter for fracture with delayed healing: Secondary | ICD-10-CM | POA: Diagnosis not present

## 2022-10-15 DIAGNOSIS — M25512 Pain in left shoulder: Secondary | ICD-10-CM | POA: Diagnosis not present

## 2022-10-15 DIAGNOSIS — M25612 Stiffness of left shoulder, not elsewhere classified: Secondary | ICD-10-CM | POA: Diagnosis not present

## 2022-10-15 DIAGNOSIS — I890 Lymphedema, not elsewhere classified: Secondary | ICD-10-CM | POA: Diagnosis not present

## 2022-10-15 DIAGNOSIS — G8929 Other chronic pain: Secondary | ICD-10-CM | POA: Diagnosis not present

## 2022-10-15 DIAGNOSIS — C499 Malignant neoplasm of connective and soft tissue, unspecified: Secondary | ICD-10-CM | POA: Diagnosis not present

## 2022-10-16 DIAGNOSIS — H524 Presbyopia: Secondary | ICD-10-CM | POA: Diagnosis not present

## 2022-10-19 DIAGNOSIS — S42215G Unspecified nondisplaced fracture of surgical neck of left humerus, subsequent encounter for fracture with delayed healing: Secondary | ICD-10-CM | POA: Diagnosis not present

## 2022-10-19 DIAGNOSIS — M25612 Stiffness of left shoulder, not elsewhere classified: Secondary | ICD-10-CM | POA: Diagnosis not present

## 2022-10-19 DIAGNOSIS — C499 Malignant neoplasm of connective and soft tissue, unspecified: Secondary | ICD-10-CM | POA: Diagnosis not present

## 2022-10-19 DIAGNOSIS — I890 Lymphedema, not elsewhere classified: Secondary | ICD-10-CM | POA: Diagnosis not present

## 2022-10-19 DIAGNOSIS — M25512 Pain in left shoulder: Secondary | ICD-10-CM | POA: Diagnosis not present

## 2022-10-19 DIAGNOSIS — G8929 Other chronic pain: Secondary | ICD-10-CM | POA: Diagnosis not present

## 2022-10-19 DIAGNOSIS — M7989 Other specified soft tissue disorders: Secondary | ICD-10-CM | POA: Diagnosis not present

## 2022-10-22 DIAGNOSIS — S42215G Unspecified nondisplaced fracture of surgical neck of left humerus, subsequent encounter for fracture with delayed healing: Secondary | ICD-10-CM | POA: Diagnosis not present

## 2022-10-22 DIAGNOSIS — M25612 Stiffness of left shoulder, not elsewhere classified: Secondary | ICD-10-CM | POA: Diagnosis not present

## 2022-10-22 DIAGNOSIS — I890 Lymphedema, not elsewhere classified: Secondary | ICD-10-CM | POA: Diagnosis not present

## 2022-10-22 DIAGNOSIS — M7989 Other specified soft tissue disorders: Secondary | ICD-10-CM | POA: Diagnosis not present

## 2022-10-22 DIAGNOSIS — G8929 Other chronic pain: Secondary | ICD-10-CM | POA: Diagnosis not present

## 2022-10-22 DIAGNOSIS — M25512 Pain in left shoulder: Secondary | ICD-10-CM | POA: Diagnosis not present

## 2022-10-22 DIAGNOSIS — C499 Malignant neoplasm of connective and soft tissue, unspecified: Secondary | ICD-10-CM | POA: Diagnosis not present

## 2022-10-25 DIAGNOSIS — M25512 Pain in left shoulder: Secondary | ICD-10-CM | POA: Diagnosis not present

## 2022-10-25 DIAGNOSIS — S42215G Unspecified nondisplaced fracture of surgical neck of left humerus, subsequent encounter for fracture with delayed healing: Secondary | ICD-10-CM | POA: Diagnosis not present

## 2022-10-25 DIAGNOSIS — M7989 Other specified soft tissue disorders: Secondary | ICD-10-CM | POA: Diagnosis not present

## 2022-10-25 DIAGNOSIS — M25612 Stiffness of left shoulder, not elsewhere classified: Secondary | ICD-10-CM | POA: Diagnosis not present

## 2022-10-25 DIAGNOSIS — C499 Malignant neoplasm of connective and soft tissue, unspecified: Secondary | ICD-10-CM | POA: Diagnosis not present

## 2022-10-25 DIAGNOSIS — G8929 Other chronic pain: Secondary | ICD-10-CM | POA: Diagnosis not present

## 2022-10-25 DIAGNOSIS — I890 Lymphedema, not elsewhere classified: Secondary | ICD-10-CM | POA: Diagnosis not present

## 2022-10-29 DIAGNOSIS — M7989 Other specified soft tissue disorders: Secondary | ICD-10-CM | POA: Diagnosis not present

## 2022-10-29 DIAGNOSIS — M25512 Pain in left shoulder: Secondary | ICD-10-CM | POA: Diagnosis not present

## 2022-10-29 DIAGNOSIS — M25612 Stiffness of left shoulder, not elsewhere classified: Secondary | ICD-10-CM | POA: Diagnosis not present

## 2022-10-29 DIAGNOSIS — C499 Malignant neoplasm of connective and soft tissue, unspecified: Secondary | ICD-10-CM | POA: Diagnosis not present

## 2022-10-29 DIAGNOSIS — G8929 Other chronic pain: Secondary | ICD-10-CM | POA: Diagnosis not present

## 2022-10-29 DIAGNOSIS — I890 Lymphedema, not elsewhere classified: Secondary | ICD-10-CM | POA: Diagnosis not present

## 2022-10-29 DIAGNOSIS — S42215G Unspecified nondisplaced fracture of surgical neck of left humerus, subsequent encounter for fracture with delayed healing: Secondary | ICD-10-CM | POA: Diagnosis not present

## 2022-10-30 DIAGNOSIS — C7801 Secondary malignant neoplasm of right lung: Secondary | ICD-10-CM | POA: Diagnosis not present

## 2022-10-30 DIAGNOSIS — C7802 Secondary malignant neoplasm of left lung: Secondary | ICD-10-CM | POA: Diagnosis not present

## 2022-10-30 DIAGNOSIS — K219 Gastro-esophageal reflux disease without esophagitis: Secondary | ICD-10-CM | POA: Diagnosis not present

## 2022-10-30 DIAGNOSIS — C499 Malignant neoplasm of connective and soft tissue, unspecified: Secondary | ICD-10-CM | POA: Diagnosis not present

## 2022-10-30 DIAGNOSIS — Z95828 Presence of other vascular implants and grafts: Secondary | ICD-10-CM | POA: Diagnosis not present

## 2022-10-30 DIAGNOSIS — Z006 Encounter for examination for normal comparison and control in clinical research program: Secondary | ICD-10-CM | POA: Diagnosis not present

## 2022-11-01 DIAGNOSIS — M7989 Other specified soft tissue disorders: Secondary | ICD-10-CM | POA: Diagnosis not present

## 2022-11-01 DIAGNOSIS — M25512 Pain in left shoulder: Secondary | ICD-10-CM | POA: Diagnosis not present

## 2022-11-01 DIAGNOSIS — C499 Malignant neoplasm of connective and soft tissue, unspecified: Secondary | ICD-10-CM | POA: Diagnosis not present

## 2022-11-01 DIAGNOSIS — S42215G Unspecified nondisplaced fracture of surgical neck of left humerus, subsequent encounter for fracture with delayed healing: Secondary | ICD-10-CM | POA: Diagnosis not present

## 2022-11-01 DIAGNOSIS — G8929 Other chronic pain: Secondary | ICD-10-CM | POA: Diagnosis not present

## 2022-11-01 DIAGNOSIS — I890 Lymphedema, not elsewhere classified: Secondary | ICD-10-CM | POA: Diagnosis not present

## 2022-11-01 DIAGNOSIS — M25612 Stiffness of left shoulder, not elsewhere classified: Secondary | ICD-10-CM | POA: Diagnosis not present

## 2022-11-05 DIAGNOSIS — M7989 Other specified soft tissue disorders: Secondary | ICD-10-CM | POA: Diagnosis not present

## 2022-11-05 DIAGNOSIS — S42215G Unspecified nondisplaced fracture of surgical neck of left humerus, subsequent encounter for fracture with delayed healing: Secondary | ICD-10-CM | POA: Diagnosis not present

## 2022-11-05 DIAGNOSIS — C499 Malignant neoplasm of connective and soft tissue, unspecified: Secondary | ICD-10-CM | POA: Diagnosis not present

## 2022-11-05 DIAGNOSIS — M25612 Stiffness of left shoulder, not elsewhere classified: Secondary | ICD-10-CM | POA: Diagnosis not present

## 2022-11-05 DIAGNOSIS — G8929 Other chronic pain: Secondary | ICD-10-CM | POA: Diagnosis not present

## 2022-11-05 DIAGNOSIS — M25512 Pain in left shoulder: Secondary | ICD-10-CM | POA: Diagnosis not present

## 2022-11-05 DIAGNOSIS — I890 Lymphedema, not elsewhere classified: Secondary | ICD-10-CM | POA: Diagnosis not present

## 2022-11-08 DIAGNOSIS — C499 Malignant neoplasm of connective and soft tissue, unspecified: Secondary | ICD-10-CM | POA: Diagnosis not present

## 2022-11-08 DIAGNOSIS — M25612 Stiffness of left shoulder, not elsewhere classified: Secondary | ICD-10-CM | POA: Diagnosis not present

## 2022-11-08 DIAGNOSIS — G8929 Other chronic pain: Secondary | ICD-10-CM | POA: Diagnosis not present

## 2022-11-08 DIAGNOSIS — M25512 Pain in left shoulder: Secondary | ICD-10-CM | POA: Diagnosis not present

## 2022-11-08 DIAGNOSIS — M7989 Other specified soft tissue disorders: Secondary | ICD-10-CM | POA: Diagnosis not present

## 2022-11-08 DIAGNOSIS — I890 Lymphedema, not elsewhere classified: Secondary | ICD-10-CM | POA: Diagnosis not present

## 2022-11-08 DIAGNOSIS — S42215G Unspecified nondisplaced fracture of surgical neck of left humerus, subsequent encounter for fracture with delayed healing: Secondary | ICD-10-CM | POA: Diagnosis not present

## 2022-11-12 DIAGNOSIS — I890 Lymphedema, not elsewhere classified: Secondary | ICD-10-CM | POA: Diagnosis not present

## 2022-11-12 DIAGNOSIS — C499 Malignant neoplasm of connective and soft tissue, unspecified: Secondary | ICD-10-CM | POA: Diagnosis not present

## 2022-11-12 DIAGNOSIS — M7989 Other specified soft tissue disorders: Secondary | ICD-10-CM | POA: Diagnosis not present

## 2022-11-12 DIAGNOSIS — M25512 Pain in left shoulder: Secondary | ICD-10-CM | POA: Diagnosis not present

## 2022-11-12 DIAGNOSIS — S42215G Unspecified nondisplaced fracture of surgical neck of left humerus, subsequent encounter for fracture with delayed healing: Secondary | ICD-10-CM | POA: Diagnosis not present

## 2022-11-12 DIAGNOSIS — G8929 Other chronic pain: Secondary | ICD-10-CM | POA: Diagnosis not present

## 2022-11-12 DIAGNOSIS — M25612 Stiffness of left shoulder, not elsewhere classified: Secondary | ICD-10-CM | POA: Diagnosis not present

## 2022-11-15 DIAGNOSIS — C499 Malignant neoplasm of connective and soft tissue, unspecified: Secondary | ICD-10-CM | POA: Diagnosis not present

## 2022-11-15 DIAGNOSIS — M25612 Stiffness of left shoulder, not elsewhere classified: Secondary | ICD-10-CM | POA: Diagnosis not present

## 2022-11-15 DIAGNOSIS — S42215G Unspecified nondisplaced fracture of surgical neck of left humerus, subsequent encounter for fracture with delayed healing: Secondary | ICD-10-CM | POA: Diagnosis not present

## 2022-11-15 DIAGNOSIS — M7989 Other specified soft tissue disorders: Secondary | ICD-10-CM | POA: Diagnosis not present

## 2022-11-15 DIAGNOSIS — G8929 Other chronic pain: Secondary | ICD-10-CM | POA: Diagnosis not present

## 2022-11-15 DIAGNOSIS — M25512 Pain in left shoulder: Secondary | ICD-10-CM | POA: Diagnosis not present

## 2022-11-15 DIAGNOSIS — I890 Lymphedema, not elsewhere classified: Secondary | ICD-10-CM | POA: Diagnosis not present

## 2022-11-19 DIAGNOSIS — C499 Malignant neoplasm of connective and soft tissue, unspecified: Secondary | ICD-10-CM | POA: Diagnosis not present

## 2022-11-19 DIAGNOSIS — M7989 Other specified soft tissue disorders: Secondary | ICD-10-CM | POA: Diagnosis not present

## 2022-11-19 DIAGNOSIS — M25612 Stiffness of left shoulder, not elsewhere classified: Secondary | ICD-10-CM | POA: Diagnosis not present

## 2022-11-19 DIAGNOSIS — S42215G Unspecified nondisplaced fracture of surgical neck of left humerus, subsequent encounter for fracture with delayed healing: Secondary | ICD-10-CM | POA: Diagnosis not present

## 2022-11-19 DIAGNOSIS — G8929 Other chronic pain: Secondary | ICD-10-CM | POA: Diagnosis not present

## 2022-11-19 DIAGNOSIS — M25512 Pain in left shoulder: Secondary | ICD-10-CM | POA: Diagnosis not present

## 2022-11-19 DIAGNOSIS — I890 Lymphedema, not elsewhere classified: Secondary | ICD-10-CM | POA: Diagnosis not present

## 2022-11-20 DIAGNOSIS — Z79899 Other long term (current) drug therapy: Secondary | ICD-10-CM | POA: Diagnosis not present

## 2022-11-20 DIAGNOSIS — E039 Hypothyroidism, unspecified: Secondary | ICD-10-CM | POA: Diagnosis not present

## 2022-11-20 DIAGNOSIS — C7801 Secondary malignant neoplasm of right lung: Secondary | ICD-10-CM | POA: Diagnosis not present

## 2022-11-20 DIAGNOSIS — Z006 Encounter for examination for normal comparison and control in clinical research program: Secondary | ICD-10-CM | POA: Diagnosis not present

## 2022-11-20 DIAGNOSIS — I2782 Chronic pulmonary embolism: Secondary | ICD-10-CM | POA: Diagnosis not present

## 2022-11-20 DIAGNOSIS — C549 Malignant neoplasm of corpus uteri, unspecified: Secondary | ICD-10-CM | POA: Diagnosis not present

## 2022-11-20 DIAGNOSIS — R112 Nausea with vomiting, unspecified: Secondary | ICD-10-CM | POA: Diagnosis not present

## 2022-11-20 DIAGNOSIS — C499 Malignant neoplasm of connective and soft tissue, unspecified: Secondary | ICD-10-CM | POA: Diagnosis not present

## 2022-11-20 DIAGNOSIS — R530 Neoplastic (malignant) related fatigue: Secondary | ICD-10-CM | POA: Diagnosis not present

## 2022-11-20 DIAGNOSIS — T451X5A Adverse effect of antineoplastic and immunosuppressive drugs, initial encounter: Secondary | ICD-10-CM | POA: Diagnosis not present

## 2022-11-20 DIAGNOSIS — C7802 Secondary malignant neoplasm of left lung: Secondary | ICD-10-CM | POA: Diagnosis not present

## 2022-11-22 DIAGNOSIS — C499 Malignant neoplasm of connective and soft tissue, unspecified: Secondary | ICD-10-CM | POA: Diagnosis not present

## 2022-11-22 DIAGNOSIS — G8929 Other chronic pain: Secondary | ICD-10-CM | POA: Diagnosis not present

## 2022-11-22 DIAGNOSIS — I890 Lymphedema, not elsewhere classified: Secondary | ICD-10-CM | POA: Diagnosis not present

## 2022-11-22 DIAGNOSIS — M25612 Stiffness of left shoulder, not elsewhere classified: Secondary | ICD-10-CM | POA: Diagnosis not present

## 2022-11-22 DIAGNOSIS — M25512 Pain in left shoulder: Secondary | ICD-10-CM | POA: Diagnosis not present

## 2022-11-22 DIAGNOSIS — S42215G Unspecified nondisplaced fracture of surgical neck of left humerus, subsequent encounter for fracture with delayed healing: Secondary | ICD-10-CM | POA: Diagnosis not present

## 2022-11-22 DIAGNOSIS — M7989 Other specified soft tissue disorders: Secondary | ICD-10-CM | POA: Diagnosis not present

## 2022-11-26 DIAGNOSIS — I890 Lymphedema, not elsewhere classified: Secondary | ICD-10-CM | POA: Diagnosis not present

## 2022-11-26 DIAGNOSIS — S42215G Unspecified nondisplaced fracture of surgical neck of left humerus, subsequent encounter for fracture with delayed healing: Secondary | ICD-10-CM | POA: Diagnosis not present

## 2022-11-26 DIAGNOSIS — C499 Malignant neoplasm of connective and soft tissue, unspecified: Secondary | ICD-10-CM | POA: Diagnosis not present

## 2022-11-26 DIAGNOSIS — G8929 Other chronic pain: Secondary | ICD-10-CM | POA: Diagnosis not present

## 2022-11-26 DIAGNOSIS — M7989 Other specified soft tissue disorders: Secondary | ICD-10-CM | POA: Diagnosis not present

## 2022-11-26 DIAGNOSIS — M25512 Pain in left shoulder: Secondary | ICD-10-CM | POA: Diagnosis not present

## 2022-11-26 DIAGNOSIS — M25612 Stiffness of left shoulder, not elsewhere classified: Secondary | ICD-10-CM | POA: Diagnosis not present

## 2022-12-03 DIAGNOSIS — M7989 Other specified soft tissue disorders: Secondary | ICD-10-CM | POA: Diagnosis not present

## 2022-12-03 DIAGNOSIS — C499 Malignant neoplasm of connective and soft tissue, unspecified: Secondary | ICD-10-CM | POA: Diagnosis not present

## 2022-12-03 DIAGNOSIS — G8929 Other chronic pain: Secondary | ICD-10-CM | POA: Diagnosis not present

## 2022-12-03 DIAGNOSIS — I890 Lymphedema, not elsewhere classified: Secondary | ICD-10-CM | POA: Diagnosis not present

## 2022-12-03 DIAGNOSIS — M25612 Stiffness of left shoulder, not elsewhere classified: Secondary | ICD-10-CM | POA: Diagnosis not present

## 2022-12-03 DIAGNOSIS — S42215G Unspecified nondisplaced fracture of surgical neck of left humerus, subsequent encounter for fracture with delayed healing: Secondary | ICD-10-CM | POA: Diagnosis not present

## 2022-12-03 DIAGNOSIS — M25512 Pain in left shoulder: Secondary | ICD-10-CM | POA: Diagnosis not present

## 2022-12-10 DIAGNOSIS — M7989 Other specified soft tissue disorders: Secondary | ICD-10-CM | POA: Diagnosis not present

## 2022-12-10 DIAGNOSIS — M25512 Pain in left shoulder: Secondary | ICD-10-CM | POA: Diagnosis not present

## 2022-12-10 DIAGNOSIS — G8929 Other chronic pain: Secondary | ICD-10-CM | POA: Diagnosis not present

## 2022-12-10 DIAGNOSIS — C499 Malignant neoplasm of connective and soft tissue, unspecified: Secondary | ICD-10-CM | POA: Diagnosis not present

## 2022-12-10 DIAGNOSIS — M25612 Stiffness of left shoulder, not elsewhere classified: Secondary | ICD-10-CM | POA: Diagnosis not present

## 2022-12-10 DIAGNOSIS — S42215G Unspecified nondisplaced fracture of surgical neck of left humerus, subsequent encounter for fracture with delayed healing: Secondary | ICD-10-CM | POA: Diagnosis not present

## 2022-12-10 DIAGNOSIS — I890 Lymphedema, not elsewhere classified: Secondary | ICD-10-CM | POA: Diagnosis not present

## 2022-12-18 DIAGNOSIS — C7802 Secondary malignant neoplasm of left lung: Secondary | ICD-10-CM | POA: Diagnosis not present

## 2022-12-18 DIAGNOSIS — C499 Malignant neoplasm of connective and soft tissue, unspecified: Secondary | ICD-10-CM | POA: Diagnosis not present

## 2022-12-18 DIAGNOSIS — K219 Gastro-esophageal reflux disease without esophagitis: Secondary | ICD-10-CM | POA: Diagnosis not present

## 2022-12-18 DIAGNOSIS — M62838 Other muscle spasm: Secondary | ICD-10-CM | POA: Diagnosis not present

## 2022-12-18 DIAGNOSIS — C7801 Secondary malignant neoplasm of right lung: Secondary | ICD-10-CM | POA: Diagnosis not present

## 2022-12-18 DIAGNOSIS — I2782 Chronic pulmonary embolism: Secondary | ICD-10-CM | POA: Diagnosis not present

## 2022-12-18 DIAGNOSIS — D702 Other drug-induced agranulocytosis: Secondary | ICD-10-CM | POA: Diagnosis not present

## 2022-12-18 DIAGNOSIS — Z79899 Other long term (current) drug therapy: Secondary | ICD-10-CM | POA: Diagnosis not present

## 2022-12-18 DIAGNOSIS — B3731 Acute candidiasis of vulva and vagina: Secondary | ICD-10-CM | POA: Diagnosis not present

## 2022-12-24 DIAGNOSIS — S42215G Unspecified nondisplaced fracture of surgical neck of left humerus, subsequent encounter for fracture with delayed healing: Secondary | ICD-10-CM | POA: Diagnosis not present

## 2022-12-24 DIAGNOSIS — M25612 Stiffness of left shoulder, not elsewhere classified: Secondary | ICD-10-CM | POA: Diagnosis not present

## 2022-12-24 DIAGNOSIS — M7989 Other specified soft tissue disorders: Secondary | ICD-10-CM | POA: Diagnosis not present

## 2022-12-24 DIAGNOSIS — M25512 Pain in left shoulder: Secondary | ICD-10-CM | POA: Diagnosis not present

## 2022-12-24 DIAGNOSIS — C499 Malignant neoplasm of connective and soft tissue, unspecified: Secondary | ICD-10-CM | POA: Diagnosis not present

## 2022-12-24 DIAGNOSIS — I890 Lymphedema, not elsewhere classified: Secondary | ICD-10-CM | POA: Diagnosis not present

## 2022-12-24 DIAGNOSIS — G8929 Other chronic pain: Secondary | ICD-10-CM | POA: Diagnosis not present

## 2023-01-01 DIAGNOSIS — Z79899 Other long term (current) drug therapy: Secondary | ICD-10-CM | POA: Diagnosis not present

## 2023-01-10 DIAGNOSIS — C7801 Secondary malignant neoplasm of right lung: Secondary | ICD-10-CM | POA: Diagnosis not present

## 2023-01-10 DIAGNOSIS — T402X5A Adverse effect of other opioids, initial encounter: Secondary | ICD-10-CM | POA: Diagnosis not present

## 2023-01-10 DIAGNOSIS — K5903 Drug induced constipation: Secondary | ICD-10-CM | POA: Diagnosis not present

## 2023-01-10 DIAGNOSIS — R530 Neoplastic (malignant) related fatigue: Secondary | ICD-10-CM | POA: Diagnosis not present

## 2023-01-10 DIAGNOSIS — C7802 Secondary malignant neoplasm of left lung: Secondary | ICD-10-CM | POA: Diagnosis not present

## 2023-01-10 DIAGNOSIS — Z7189 Other specified counseling: Secondary | ICD-10-CM | POA: Diagnosis not present

## 2023-01-10 DIAGNOSIS — Z515 Encounter for palliative care: Secondary | ICD-10-CM | POA: Diagnosis not present

## 2023-01-10 DIAGNOSIS — G893 Neoplasm related pain (acute) (chronic): Secondary | ICD-10-CM | POA: Diagnosis not present

## 2023-01-22 DIAGNOSIS — C7801 Secondary malignant neoplasm of right lung: Secondary | ICD-10-CM | POA: Diagnosis not present

## 2023-01-22 DIAGNOSIS — G8929 Other chronic pain: Secondary | ICD-10-CM | POA: Diagnosis not present

## 2023-01-22 DIAGNOSIS — R5383 Other fatigue: Secondary | ICD-10-CM | POA: Diagnosis not present

## 2023-01-22 DIAGNOSIS — T402X5A Adverse effect of other opioids, initial encounter: Secondary | ICD-10-CM | POA: Diagnosis not present

## 2023-01-22 DIAGNOSIS — R530 Neoplastic (malignant) related fatigue: Secondary | ICD-10-CM | POA: Diagnosis not present

## 2023-01-22 DIAGNOSIS — K5903 Drug induced constipation: Secondary | ICD-10-CM | POA: Diagnosis not present

## 2023-01-22 DIAGNOSIS — C7802 Secondary malignant neoplasm of left lung: Secondary | ICD-10-CM | POA: Diagnosis not present

## 2023-01-22 DIAGNOSIS — M25512 Pain in left shoulder: Secondary | ICD-10-CM | POA: Diagnosis not present

## 2023-01-22 DIAGNOSIS — Z515 Encounter for palliative care: Secondary | ICD-10-CM | POA: Diagnosis not present

## 2023-01-23 DIAGNOSIS — S42202A Unspecified fracture of upper end of left humerus, initial encounter for closed fracture: Secondary | ICD-10-CM | POA: Diagnosis not present

## 2023-01-23 DIAGNOSIS — X58XXXD Exposure to other specified factors, subsequent encounter: Secondary | ICD-10-CM | POA: Diagnosis not present

## 2023-01-23 DIAGNOSIS — W101XXA Fall (on)(from) sidewalk curb, initial encounter: Secondary | ICD-10-CM | POA: Diagnosis not present

## 2023-01-23 DIAGNOSIS — S42202D Unspecified fracture of upper end of left humerus, subsequent encounter for fracture with routine healing: Secondary | ICD-10-CM | POA: Diagnosis not present

## 2023-01-23 DIAGNOSIS — Y92480 Sidewalk as the place of occurrence of the external cause: Secondary | ICD-10-CM | POA: Diagnosis not present

## 2023-01-23 DIAGNOSIS — Z8781 Personal history of (healed) traumatic fracture: Secondary | ICD-10-CM | POA: Diagnosis not present

## 2023-02-04 DIAGNOSIS — M4722 Other spondylosis with radiculopathy, cervical region: Secondary | ICD-10-CM | POA: Diagnosis not present

## 2023-02-04 DIAGNOSIS — M4802 Spinal stenosis, cervical region: Secondary | ICD-10-CM | POA: Diagnosis not present

## 2023-02-04 DIAGNOSIS — M5412 Radiculopathy, cervical region: Secondary | ICD-10-CM | POA: Diagnosis not present

## 2023-02-04 DIAGNOSIS — M5010 Cervical disc disorder with radiculopathy, unspecified cervical region: Secondary | ICD-10-CM | POA: Diagnosis not present

## 2023-02-04 DIAGNOSIS — M4312 Spondylolisthesis, cervical region: Secondary | ICD-10-CM | POA: Diagnosis not present

## 2023-02-08 DIAGNOSIS — R590 Localized enlarged lymph nodes: Secondary | ICD-10-CM | POA: Diagnosis not present

## 2023-02-08 DIAGNOSIS — M4804 Spinal stenosis, thoracic region: Secondary | ICD-10-CM | POA: Diagnosis not present

## 2023-02-08 DIAGNOSIS — M5412 Radiculopathy, cervical region: Secondary | ICD-10-CM | POA: Diagnosis not present

## 2023-02-08 DIAGNOSIS — M4802 Spinal stenosis, cervical region: Secondary | ICD-10-CM | POA: Diagnosis not present

## 2023-02-13 DIAGNOSIS — M5412 Radiculopathy, cervical region: Secondary | ICD-10-CM | POA: Diagnosis not present

## 2023-02-19 DIAGNOSIS — Z79899 Other long term (current) drug therapy: Secondary | ICD-10-CM | POA: Diagnosis not present

## 2023-02-19 DIAGNOSIS — I2782 Chronic pulmonary embolism: Secondary | ICD-10-CM | POA: Diagnosis not present

## 2023-02-19 DIAGNOSIS — T451X5A Adverse effect of antineoplastic and immunosuppressive drugs, initial encounter: Secondary | ICD-10-CM | POA: Diagnosis not present

## 2023-02-19 DIAGNOSIS — R112 Nausea with vomiting, unspecified: Secondary | ICD-10-CM | POA: Diagnosis not present

## 2023-02-19 DIAGNOSIS — C7802 Secondary malignant neoplasm of left lung: Secondary | ICD-10-CM | POA: Diagnosis not present

## 2023-02-19 DIAGNOSIS — C4921 Malignant neoplasm of connective and soft tissue of right lower limb, including hip: Secondary | ICD-10-CM | POA: Diagnosis not present

## 2023-02-19 DIAGNOSIS — R530 Neoplastic (malignant) related fatigue: Secondary | ICD-10-CM | POA: Diagnosis not present

## 2023-02-19 DIAGNOSIS — C499 Malignant neoplasm of connective and soft tissue, unspecified: Secondary | ICD-10-CM | POA: Diagnosis not present

## 2023-02-19 DIAGNOSIS — C7801 Secondary malignant neoplasm of right lung: Secondary | ICD-10-CM | POA: Diagnosis not present

## 2023-02-19 DIAGNOSIS — Z7901 Long term (current) use of anticoagulants: Secondary | ICD-10-CM | POA: Diagnosis not present

## 2023-02-19 DIAGNOSIS — C782 Secondary malignant neoplasm of pleura: Secondary | ICD-10-CM | POA: Diagnosis not present

## 2023-02-20 DIAGNOSIS — C7801 Secondary malignant neoplasm of right lung: Secondary | ICD-10-CM | POA: Diagnosis not present

## 2023-02-20 DIAGNOSIS — C7802 Secondary malignant neoplasm of left lung: Secondary | ICD-10-CM | POA: Diagnosis not present

## 2023-02-20 DIAGNOSIS — Z515 Encounter for palliative care: Secondary | ICD-10-CM | POA: Diagnosis not present

## 2023-02-20 DIAGNOSIS — M4804 Spinal stenosis, thoracic region: Secondary | ICD-10-CM | POA: Diagnosis not present

## 2023-02-20 DIAGNOSIS — M25512 Pain in left shoulder: Secondary | ICD-10-CM | POA: Diagnosis not present

## 2023-02-20 DIAGNOSIS — G8929 Other chronic pain: Secondary | ICD-10-CM | POA: Diagnosis not present

## 2023-02-21 DIAGNOSIS — I890 Lymphedema, not elsewhere classified: Secondary | ICD-10-CM | POA: Diagnosis not present

## 2023-02-26 DIAGNOSIS — T402X5A Adverse effect of other opioids, initial encounter: Secondary | ICD-10-CM | POA: Diagnosis not present

## 2023-02-26 DIAGNOSIS — G8929 Other chronic pain: Secondary | ICD-10-CM | POA: Diagnosis not present

## 2023-02-26 DIAGNOSIS — C7802 Secondary malignant neoplasm of left lung: Secondary | ICD-10-CM | POA: Diagnosis not present

## 2023-02-26 DIAGNOSIS — M4804 Spinal stenosis, thoracic region: Secondary | ICD-10-CM | POA: Diagnosis not present

## 2023-02-26 DIAGNOSIS — K5903 Drug induced constipation: Secondary | ICD-10-CM | POA: Diagnosis not present

## 2023-02-26 DIAGNOSIS — C7801 Secondary malignant neoplasm of right lung: Secondary | ICD-10-CM | POA: Diagnosis not present

## 2023-02-26 DIAGNOSIS — F064 Anxiety disorder due to known physiological condition: Secondary | ICD-10-CM | POA: Diagnosis not present

## 2023-02-26 DIAGNOSIS — M25512 Pain in left shoulder: Secondary | ICD-10-CM | POA: Diagnosis not present

## 2023-02-26 DIAGNOSIS — Z515 Encounter for palliative care: Secondary | ICD-10-CM | POA: Diagnosis not present

## 2023-02-27 DIAGNOSIS — Z0189 Encounter for other specified special examinations: Secondary | ICD-10-CM | POA: Diagnosis not present

## 2023-02-27 DIAGNOSIS — Z79899 Other long term (current) drug therapy: Secondary | ICD-10-CM | POA: Diagnosis not present

## 2023-02-27 DIAGNOSIS — I517 Cardiomegaly: Secondary | ICD-10-CM | POA: Diagnosis not present

## 2023-02-28 DIAGNOSIS — C7802 Secondary malignant neoplasm of left lung: Secondary | ICD-10-CM | POA: Diagnosis not present

## 2023-02-28 DIAGNOSIS — Z79899 Other long term (current) drug therapy: Secondary | ICD-10-CM | POA: Diagnosis not present

## 2023-02-28 DIAGNOSIS — C499 Malignant neoplasm of connective and soft tissue, unspecified: Secondary | ICD-10-CM | POA: Diagnosis not present

## 2023-02-28 DIAGNOSIS — Z5111 Encounter for antineoplastic chemotherapy: Secondary | ICD-10-CM | POA: Diagnosis not present

## 2023-02-28 DIAGNOSIS — C495 Malignant neoplasm of connective and soft tissue of pelvis: Secondary | ICD-10-CM | POA: Diagnosis not present

## 2023-02-28 DIAGNOSIS — G893 Neoplasm related pain (acute) (chronic): Secondary | ICD-10-CM | POA: Diagnosis not present

## 2023-02-28 DIAGNOSIS — C7801 Secondary malignant neoplasm of right lung: Secondary | ICD-10-CM | POA: Diagnosis not present

## 2023-03-01 DIAGNOSIS — C499 Malignant neoplasm of connective and soft tissue, unspecified: Secondary | ICD-10-CM | POA: Diagnosis not present

## 2023-03-05 DIAGNOSIS — M4804 Spinal stenosis, thoracic region: Secondary | ICD-10-CM | POA: Diagnosis not present

## 2023-03-05 DIAGNOSIS — M47814 Spondylosis without myelopathy or radiculopathy, thoracic region: Secondary | ICD-10-CM | POA: Diagnosis not present

## 2023-03-05 DIAGNOSIS — M5124 Other intervertebral disc displacement, thoracic region: Secondary | ICD-10-CM | POA: Diagnosis not present

## 2023-03-13 DIAGNOSIS — M4807 Spinal stenosis, lumbosacral region: Secondary | ICD-10-CM | POA: Diagnosis not present

## 2023-03-13 DIAGNOSIS — M4804 Spinal stenosis, thoracic region: Secondary | ICD-10-CM | POA: Diagnosis not present

## 2023-03-14 DIAGNOSIS — I959 Hypotension, unspecified: Secondary | ICD-10-CM | POA: Diagnosis not present

## 2023-03-14 DIAGNOSIS — S99911A Unspecified injury of right ankle, initial encounter: Secondary | ICD-10-CM | POA: Diagnosis not present

## 2023-03-14 DIAGNOSIS — Z7901 Long term (current) use of anticoagulants: Secondary | ICD-10-CM | POA: Diagnosis not present

## 2023-03-14 DIAGNOSIS — M79605 Pain in left leg: Secondary | ICD-10-CM | POA: Diagnosis not present

## 2023-03-14 DIAGNOSIS — M79671 Pain in right foot: Secondary | ICD-10-CM | POA: Diagnosis not present

## 2023-03-14 DIAGNOSIS — Z86718 Personal history of other venous thrombosis and embolism: Secondary | ICD-10-CM | POA: Diagnosis not present

## 2023-03-14 DIAGNOSIS — M25571 Pain in right ankle and joints of right foot: Secondary | ICD-10-CM | POA: Diagnosis not present

## 2023-03-14 DIAGNOSIS — M25512 Pain in left shoulder: Secondary | ICD-10-CM | POA: Diagnosis not present

## 2023-03-14 DIAGNOSIS — S4992XA Unspecified injury of left shoulder and upper arm, initial encounter: Secondary | ICD-10-CM | POA: Diagnosis not present

## 2023-03-14 DIAGNOSIS — Z743 Need for continuous supervision: Secondary | ICD-10-CM | POA: Diagnosis not present

## 2023-03-14 DIAGNOSIS — R531 Weakness: Secondary | ICD-10-CM | POA: Diagnosis not present

## 2023-03-14 DIAGNOSIS — W19XXXA Unspecified fall, initial encounter: Secondary | ICD-10-CM | POA: Diagnosis not present

## 2023-03-14 DIAGNOSIS — S99921A Unspecified injury of right foot, initial encounter: Secondary | ICD-10-CM | POA: Diagnosis not present

## 2023-03-15 DIAGNOSIS — M79605 Pain in left leg: Secondary | ICD-10-CM | POA: Diagnosis not present

## 2023-03-15 DIAGNOSIS — Z86718 Personal history of other venous thrombosis and embolism: Secondary | ICD-10-CM | POA: Diagnosis not present

## 2023-03-18 DIAGNOSIS — T402X5A Adverse effect of other opioids, initial encounter: Secondary | ICD-10-CM | POA: Diagnosis not present

## 2023-03-18 DIAGNOSIS — Z515 Encounter for palliative care: Secondary | ICD-10-CM | POA: Diagnosis not present

## 2023-03-18 DIAGNOSIS — C7802 Secondary malignant neoplasm of left lung: Secondary | ICD-10-CM | POA: Diagnosis not present

## 2023-03-18 DIAGNOSIS — G8929 Other chronic pain: Secondary | ICD-10-CM | POA: Diagnosis not present

## 2023-03-18 DIAGNOSIS — M4804 Spinal stenosis, thoracic region: Secondary | ICD-10-CM | POA: Diagnosis not present

## 2023-03-18 DIAGNOSIS — M25512 Pain in left shoulder: Secondary | ICD-10-CM | POA: Diagnosis not present

## 2023-03-18 DIAGNOSIS — K5903 Drug induced constipation: Secondary | ICD-10-CM | POA: Diagnosis not present

## 2023-03-18 DIAGNOSIS — C7801 Secondary malignant neoplasm of right lung: Secondary | ICD-10-CM | POA: Diagnosis not present

## 2023-03-20 DIAGNOSIS — Z9981 Dependence on supplemental oxygen: Secondary | ICD-10-CM | POA: Diagnosis not present

## 2023-03-20 DIAGNOSIS — C7800 Secondary malignant neoplasm of unspecified lung: Secondary | ICD-10-CM | POA: Diagnosis not present

## 2023-03-20 DIAGNOSIS — C4921 Malignant neoplasm of connective and soft tissue of right lower limb, including hip: Secondary | ICD-10-CM | POA: Diagnosis not present

## 2023-03-20 DIAGNOSIS — T402X5D Adverse effect of other opioids, subsequent encounter: Secondary | ICD-10-CM | POA: Diagnosis not present

## 2023-03-20 DIAGNOSIS — K5903 Drug induced constipation: Secondary | ICD-10-CM | POA: Diagnosis not present

## 2023-03-20 DIAGNOSIS — M4803 Spinal stenosis, cervicothoracic region: Secondary | ICD-10-CM | POA: Diagnosis not present

## 2023-03-20 DIAGNOSIS — Z6823 Body mass index (BMI) 23.0-23.9, adult: Secondary | ICD-10-CM | POA: Diagnosis not present

## 2023-03-20 DIAGNOSIS — Z86711 Personal history of pulmonary embolism: Secondary | ICD-10-CM | POA: Diagnosis not present

## 2023-03-20 DIAGNOSIS — Z9181 History of falling: Secondary | ICD-10-CM | POA: Diagnosis not present

## 2023-03-20 DIAGNOSIS — G4700 Insomnia, unspecified: Secondary | ICD-10-CM | POA: Diagnosis not present

## 2023-03-20 DIAGNOSIS — E669 Obesity, unspecified: Secondary | ICD-10-CM | POA: Diagnosis not present

## 2023-03-20 DIAGNOSIS — I890 Lymphedema, not elsewhere classified: Secondary | ICD-10-CM | POA: Diagnosis not present

## 2023-03-20 DIAGNOSIS — S42215G Unspecified nondisplaced fracture of surgical neck of left humerus, subsequent encounter for fracture with delayed healing: Secondary | ICD-10-CM | POA: Diagnosis not present

## 2023-03-20 DIAGNOSIS — K219 Gastro-esophageal reflux disease without esophagitis: Secondary | ICD-10-CM | POA: Diagnosis not present

## 2023-03-20 DIAGNOSIS — M5030 Other cervical disc degeneration, unspecified cervical region: Secondary | ICD-10-CM | POA: Diagnosis not present

## 2023-03-20 DIAGNOSIS — G8929 Other chronic pain: Secondary | ICD-10-CM | POA: Diagnosis not present

## 2023-03-25 DIAGNOSIS — C499 Malignant neoplasm of connective and soft tissue, unspecified: Secondary | ICD-10-CM | POA: Diagnosis not present

## 2023-03-25 DIAGNOSIS — R0789 Other chest pain: Secondary | ICD-10-CM | POA: Diagnosis not present

## 2023-03-25 DIAGNOSIS — R000 Tachycardia, unspecified: Secondary | ICD-10-CM | POA: Diagnosis not present

## 2023-03-25 DIAGNOSIS — K5903 Drug induced constipation: Secondary | ICD-10-CM | POA: Diagnosis not present

## 2023-03-25 DIAGNOSIS — Z515 Encounter for palliative care: Secondary | ICD-10-CM | POA: Diagnosis not present

## 2023-03-25 DIAGNOSIS — Z7901 Long term (current) use of anticoagulants: Secondary | ICD-10-CM | POA: Diagnosis not present

## 2023-03-25 DIAGNOSIS — G4700 Insomnia, unspecified: Secondary | ICD-10-CM | POA: Diagnosis not present

## 2023-03-25 DIAGNOSIS — M4804 Spinal stenosis, thoracic region: Secondary | ICD-10-CM | POA: Diagnosis not present

## 2023-03-25 DIAGNOSIS — Z85831 Personal history of malignant neoplasm of soft tissue: Secondary | ICD-10-CM | POA: Diagnosis not present

## 2023-03-25 DIAGNOSIS — C7989 Secondary malignant neoplasm of other specified sites: Secondary | ICD-10-CM | POA: Diagnosis not present

## 2023-03-25 DIAGNOSIS — R64 Cachexia: Secondary | ICD-10-CM | POA: Diagnosis not present

## 2023-03-25 DIAGNOSIS — C7951 Secondary malignant neoplasm of bone: Secondary | ICD-10-CM | POA: Diagnosis not present

## 2023-03-25 DIAGNOSIS — C782 Secondary malignant neoplasm of pleura: Secondary | ICD-10-CM | POA: Diagnosis not present

## 2023-03-25 DIAGNOSIS — Z86711 Personal history of pulmonary embolism: Secondary | ICD-10-CM | POA: Diagnosis not present

## 2023-03-25 DIAGNOSIS — R0602 Shortness of breath: Secondary | ICD-10-CM | POA: Diagnosis not present

## 2023-03-25 DIAGNOSIS — R5381 Other malaise: Secondary | ICD-10-CM | POA: Diagnosis not present

## 2023-03-25 DIAGNOSIS — G8929 Other chronic pain: Secondary | ICD-10-CM | POA: Diagnosis not present

## 2023-03-25 DIAGNOSIS — R079 Chest pain, unspecified: Secondary | ICD-10-CM | POA: Diagnosis not present

## 2023-03-25 DIAGNOSIS — C7801 Secondary malignant neoplasm of right lung: Secondary | ICD-10-CM | POA: Diagnosis not present

## 2023-03-25 DIAGNOSIS — C7802 Secondary malignant neoplasm of left lung: Secondary | ICD-10-CM | POA: Diagnosis not present

## 2023-03-25 DIAGNOSIS — M25512 Pain in left shoulder: Secondary | ICD-10-CM | POA: Diagnosis not present

## 2023-03-25 DIAGNOSIS — K5900 Constipation, unspecified: Secondary | ICD-10-CM | POA: Diagnosis not present

## 2023-03-26 DIAGNOSIS — R0789 Other chest pain: Secondary | ICD-10-CM | POA: Diagnosis not present

## 2023-03-26 DIAGNOSIS — C7951 Secondary malignant neoplasm of bone: Secondary | ICD-10-CM | POA: Diagnosis not present

## 2023-03-26 DIAGNOSIS — Z515 Encounter for palliative care: Secondary | ICD-10-CM | POA: Diagnosis not present

## 2023-03-26 DIAGNOSIS — Z7901 Long term (current) use of anticoagulants: Secondary | ICD-10-CM | POA: Diagnosis not present

## 2023-03-26 DIAGNOSIS — R531 Weakness: Secondary | ICD-10-CM | POA: Diagnosis not present

## 2023-03-26 DIAGNOSIS — M5030 Other cervical disc degeneration, unspecified cervical region: Secondary | ICD-10-CM | POA: Diagnosis not present

## 2023-03-26 DIAGNOSIS — M47816 Spondylosis without myelopathy or radiculopathy, lumbar region: Secondary | ICD-10-CM | POA: Diagnosis not present

## 2023-03-26 DIAGNOSIS — E88A Wasting disease (syndrome) due to underlying condition: Secondary | ICD-10-CM | POA: Diagnosis not present

## 2023-03-26 DIAGNOSIS — R339 Retention of urine, unspecified: Secondary | ICD-10-CM | POA: Diagnosis not present

## 2023-03-26 DIAGNOSIS — R0602 Shortness of breath: Secondary | ICD-10-CM | POA: Diagnosis not present

## 2023-03-26 DIAGNOSIS — K5903 Drug induced constipation: Secondary | ICD-10-CM | POA: Diagnosis not present

## 2023-03-26 DIAGNOSIS — C499 Malignant neoplasm of connective and soft tissue, unspecified: Secondary | ICD-10-CM | POA: Diagnosis not present

## 2023-03-26 DIAGNOSIS — G4700 Insomnia, unspecified: Secondary | ICD-10-CM | POA: Diagnosis not present

## 2023-03-26 DIAGNOSIS — R5381 Other malaise: Secondary | ICD-10-CM | POA: Diagnosis not present

## 2023-03-26 DIAGNOSIS — C495 Malignant neoplasm of connective and soft tissue of pelvis: Secondary | ICD-10-CM | POA: Diagnosis not present

## 2023-03-26 DIAGNOSIS — M5126 Other intervertebral disc displacement, lumbar region: Secondary | ICD-10-CM | POA: Diagnosis not present

## 2023-03-26 DIAGNOSIS — I890 Lymphedema, not elsewhere classified: Secondary | ICD-10-CM | POA: Diagnosis not present

## 2023-03-26 DIAGNOSIS — K5900 Constipation, unspecified: Secondary | ICD-10-CM | POA: Diagnosis not present

## 2023-03-26 DIAGNOSIS — Z86711 Personal history of pulmonary embolism: Secondary | ICD-10-CM | POA: Diagnosis not present

## 2023-03-26 DIAGNOSIS — E8809 Other disorders of plasma-protein metabolism, not elsewhere classified: Secondary | ICD-10-CM | POA: Diagnosis not present

## 2023-03-26 DIAGNOSIS — C7802 Secondary malignant neoplasm of left lung: Secondary | ICD-10-CM | POA: Diagnosis not present

## 2023-03-26 DIAGNOSIS — M47817 Spondylosis without myelopathy or radiculopathy, lumbosacral region: Secondary | ICD-10-CM | POA: Diagnosis not present

## 2023-03-26 DIAGNOSIS — G893 Neoplasm related pain (acute) (chronic): Secondary | ICD-10-CM | POA: Diagnosis not present

## 2023-03-26 DIAGNOSIS — E890 Postprocedural hypothyroidism: Secondary | ICD-10-CM | POA: Diagnosis not present

## 2023-03-26 DIAGNOSIS — Z85831 Personal history of malignant neoplasm of soft tissue: Secondary | ICD-10-CM | POA: Diagnosis not present

## 2023-03-26 DIAGNOSIS — M4802 Spinal stenosis, cervical region: Secondary | ICD-10-CM | POA: Diagnosis not present

## 2023-03-26 DIAGNOSIS — T402X5A Adverse effect of other opioids, initial encounter: Secondary | ICD-10-CM | POA: Diagnosis not present

## 2023-03-26 DIAGNOSIS — Z923 Personal history of irradiation: Secondary | ICD-10-CM | POA: Diagnosis not present

## 2023-03-26 DIAGNOSIS — M4804 Spinal stenosis, thoracic region: Secondary | ICD-10-CM | POA: Diagnosis not present

## 2023-03-26 DIAGNOSIS — Z6824 Body mass index (BMI) 24.0-24.9, adult: Secondary | ICD-10-CM | POA: Diagnosis not present

## 2023-03-26 DIAGNOSIS — I2782 Chronic pulmonary embolism: Secondary | ICD-10-CM | POA: Diagnosis not present

## 2023-03-26 DIAGNOSIS — R079 Chest pain, unspecified: Secondary | ICD-10-CM | POA: Diagnosis not present

## 2023-03-26 DIAGNOSIS — C782 Secondary malignant neoplasm of pleura: Secondary | ICD-10-CM | POA: Diagnosis not present

## 2023-03-26 DIAGNOSIS — C7801 Secondary malignant neoplasm of right lung: Secondary | ICD-10-CM | POA: Diagnosis not present

## 2023-03-26 DIAGNOSIS — C7989 Secondary malignant neoplasm of other specified sites: Secondary | ICD-10-CM | POA: Diagnosis not present

## 2023-03-26 DIAGNOSIS — N179 Acute kidney failure, unspecified: Secondary | ICD-10-CM | POA: Diagnosis not present

## 2023-03-27 DIAGNOSIS — G893 Neoplasm related pain (acute) (chronic): Secondary | ICD-10-CM | POA: Diagnosis not present

## 2023-03-27 DIAGNOSIS — Z515 Encounter for palliative care: Secondary | ICD-10-CM | POA: Diagnosis not present

## 2023-03-27 DIAGNOSIS — C7801 Secondary malignant neoplasm of right lung: Secondary | ICD-10-CM | POA: Diagnosis not present

## 2023-03-27 DIAGNOSIS — C499 Malignant neoplasm of connective and soft tissue, unspecified: Secondary | ICD-10-CM | POA: Diagnosis not present

## 2023-03-27 DIAGNOSIS — C7802 Secondary malignant neoplasm of left lung: Secondary | ICD-10-CM | POA: Diagnosis not present

## 2023-03-27 DIAGNOSIS — R339 Retention of urine, unspecified: Secondary | ICD-10-CM | POA: Diagnosis not present

## 2023-03-28 DIAGNOSIS — R069 Unspecified abnormalities of breathing: Secondary | ICD-10-CM | POA: Diagnosis not present

## 2023-03-28 DIAGNOSIS — Z7401 Bed confinement status: Secondary | ICD-10-CM | POA: Diagnosis not present

## 2023-04-21 DIAGNOSIS — R918 Other nonspecific abnormal finding of lung field: Secondary | ICD-10-CM | POA: Diagnosis not present

## 2023-06-04 DEATH — deceased

## 2023-11-27 IMAGING — CR DG HUMERUS 2V *L*
1 series · 2 of 2 positions shown · non-contrast
Comparison: Left shoulder radiographs also obtained today

CLINICAL DATA: Fall.  Left upper arm pain.

EXAM:
LEFT HUMERUS - 2+ VIEW

[Series 1: dg humerus left · 0.14mm/px · 2 of 2 slices shown]
[im 1/2]
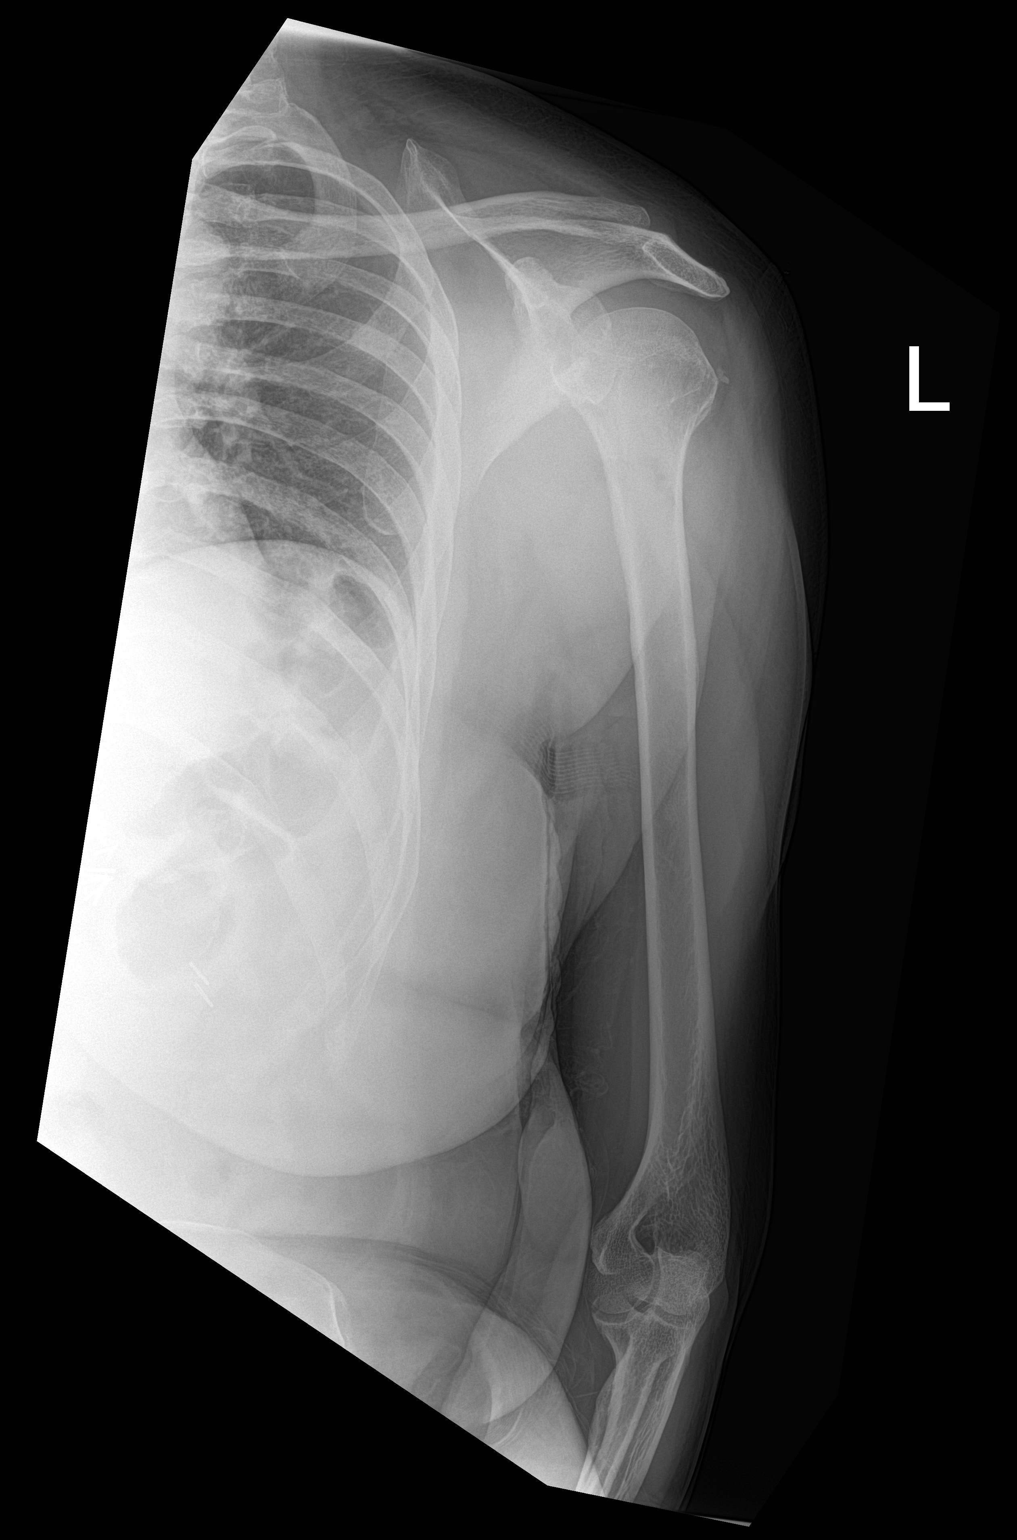
[im 2/2]
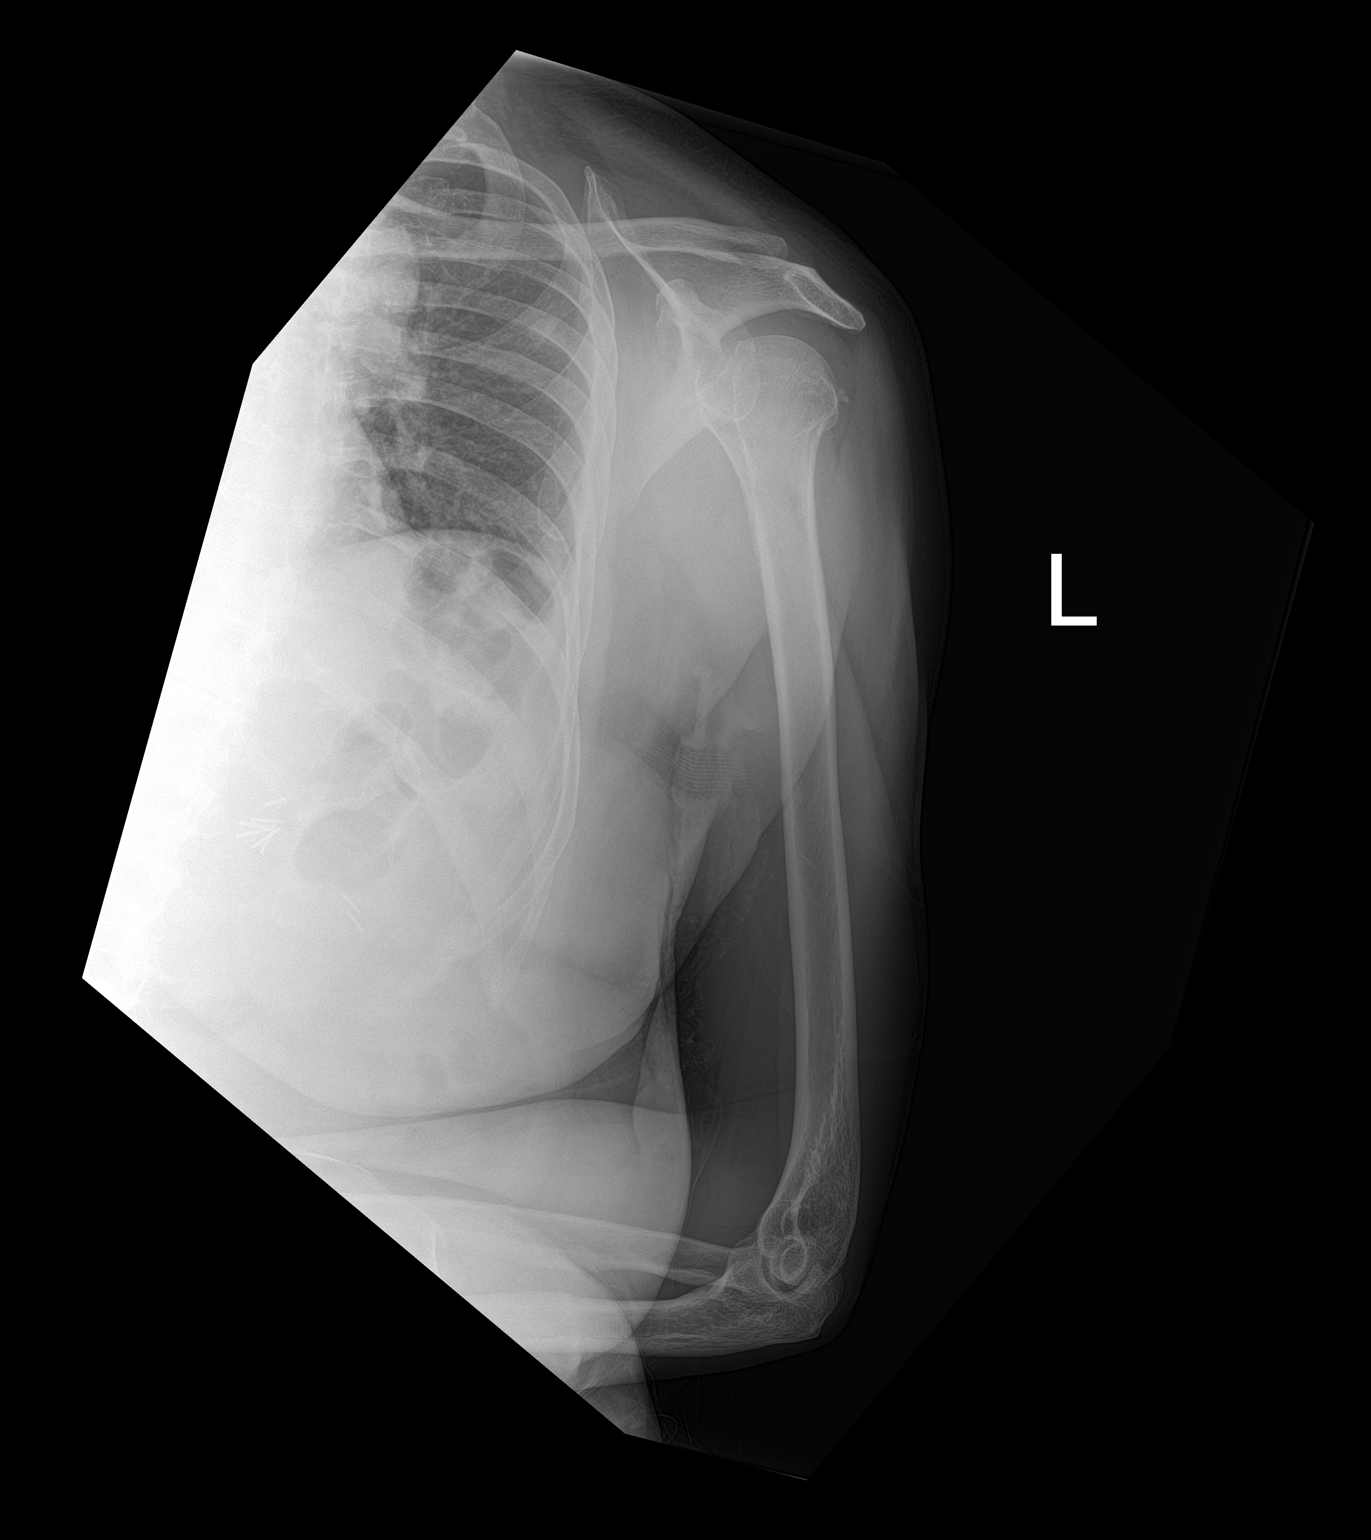

[2 of 2 positions shown; findings below may reference images not displayed]

FINDINGS: Nondisplaced left humeral neck fracture is better visualized on
dedicated shoulder radiographs obtained today. No other fracture of
humerus identified.
IMPRESSION: Nondisplaced left humeral neck fracture. No other fracture
identified.

## 2023-11-27 IMAGING — CR DG SHOULDER 2+V*L*
1 series · 3 of 3 positions shown · non-contrast
Comparison: None.

CLINICAL DATA: Fall yesterday.  Left shoulder pain.

EXAM:
LEFT SHOULDER - 2+ VIEW

[Series 1: dg shoulder left · 0.14mm/px · 3 of 3 slices shown]
[im 1/3]
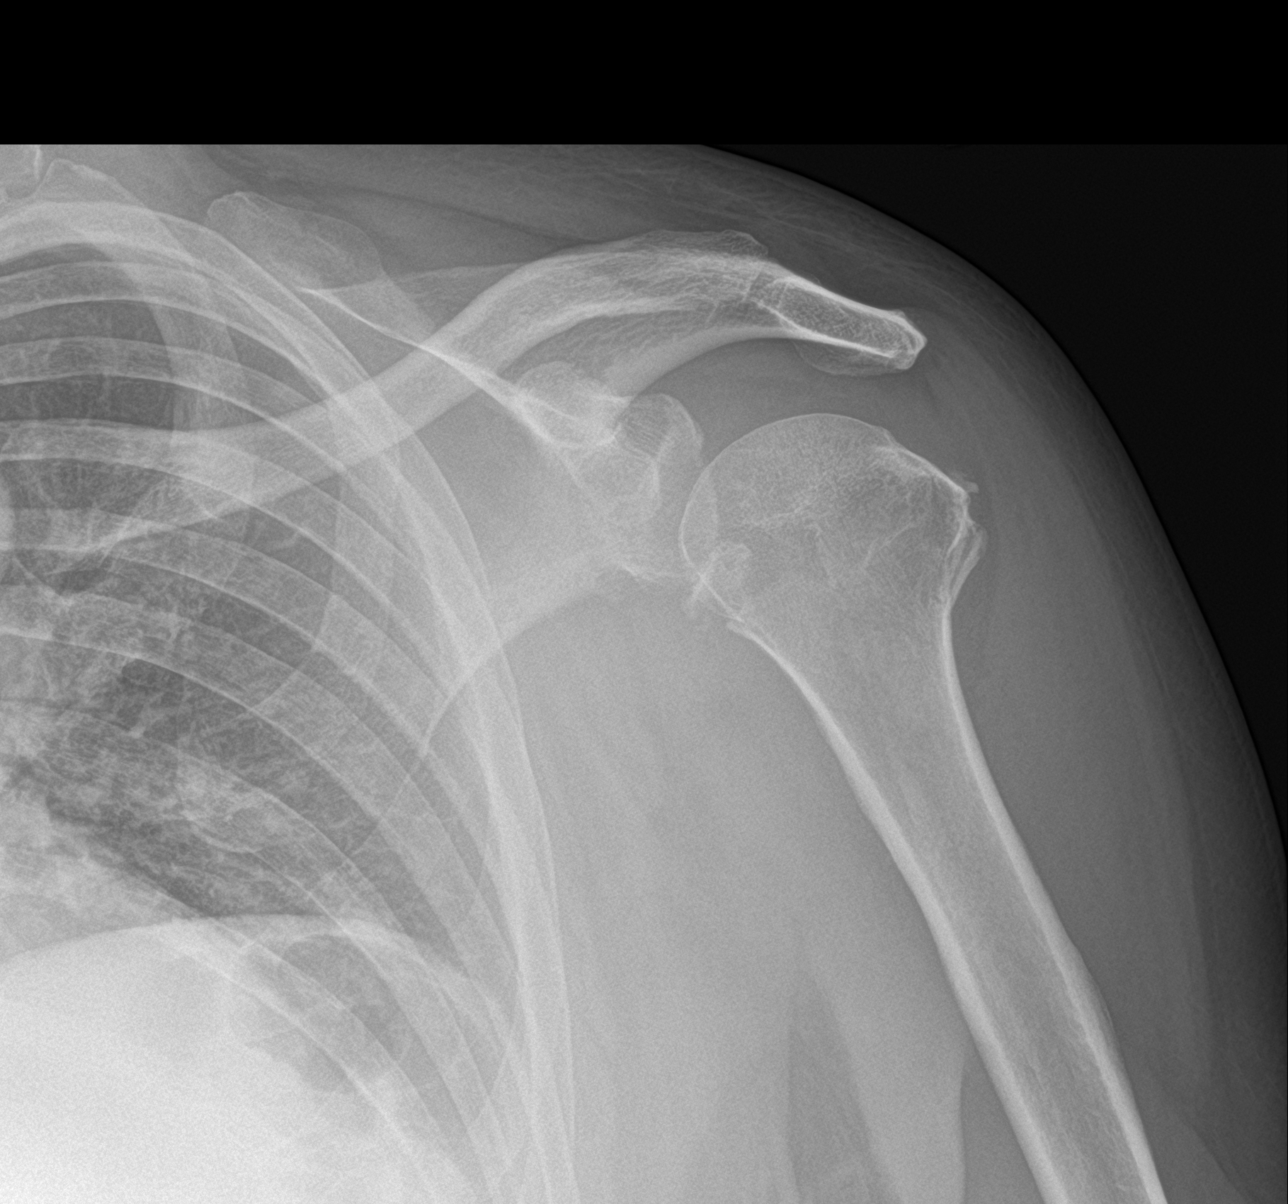
[im 2/3]
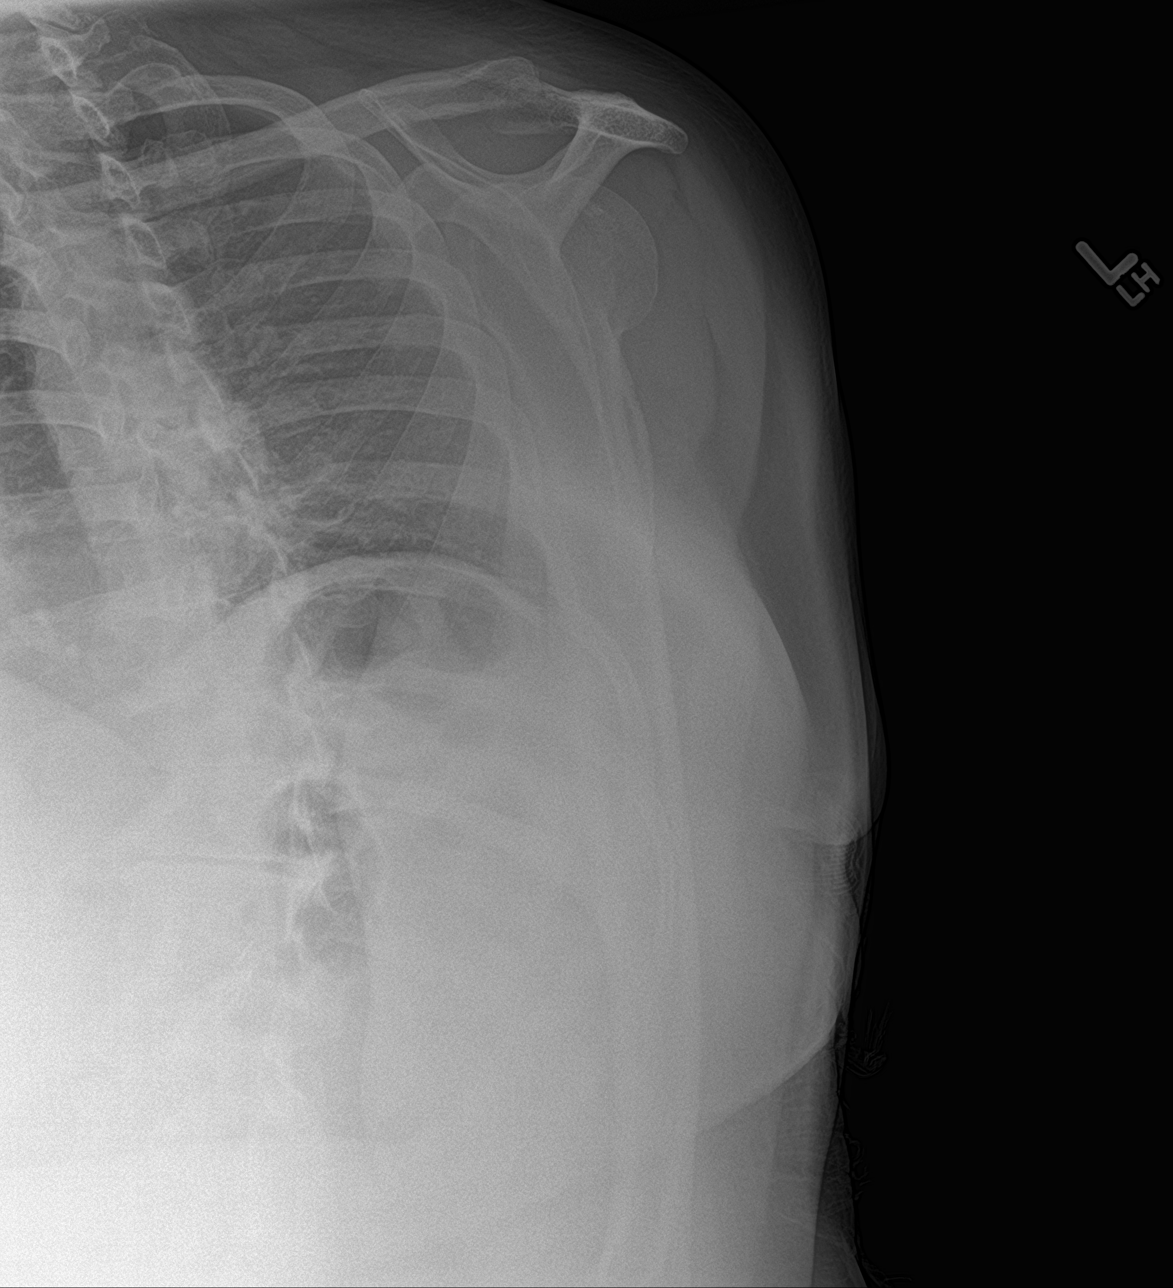
[im 3/3]
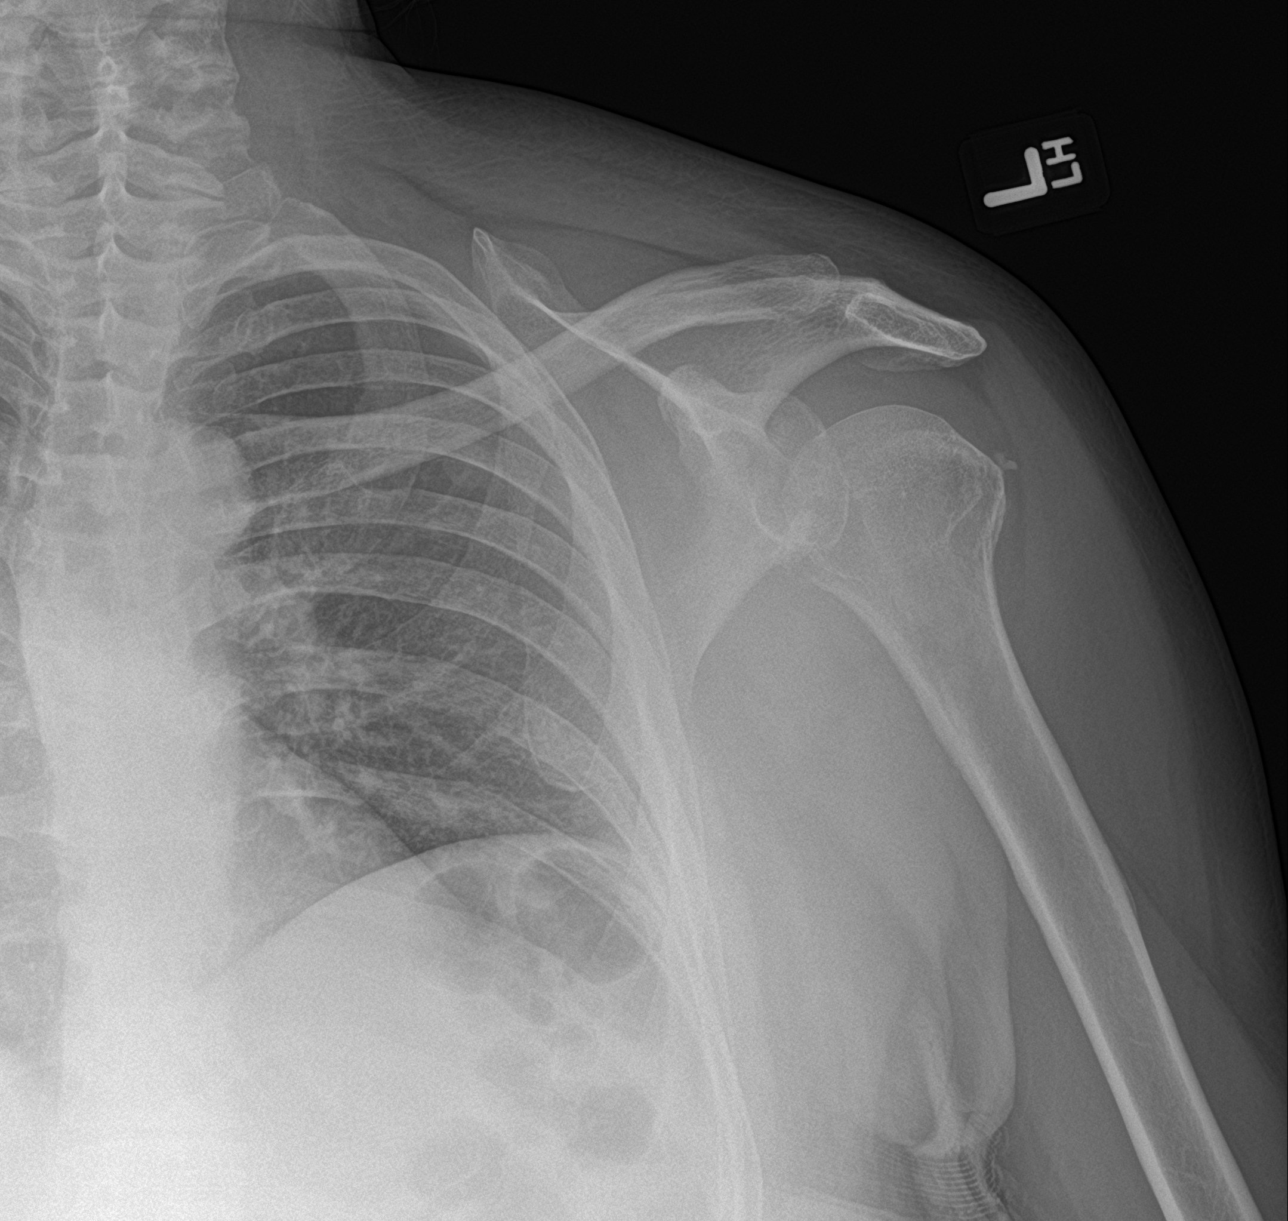

[3 of 3 positions shown; findings below may reference images not displayed]

FINDINGS: A nondisplaced fracture of the left humeral neck is seen. No
evidence of shoulder dislocation.
IMPRESSION: Nondisplaced left humeral neck fracture.
